# Patient Record
Sex: Male | Born: 1950
Health system: Southern US, Community
[De-identification: ages and names within clinical notes are randomized; demographics above are authoritative.]

## PROBLEM LIST (undated history)

## (undated) DIAGNOSIS — Z860100 Personal history of colon polyps, unspecified: Secondary | ICD-10-CM

## (undated) DIAGNOSIS — M1990 Unspecified osteoarthritis, unspecified site: Secondary | ICD-10-CM

## (undated) DIAGNOSIS — Z8719 Personal history of other diseases of the digestive system: Secondary | ICD-10-CM

## (undated) DIAGNOSIS — G5603 Carpal tunnel syndrome, bilateral upper limbs: Secondary | ICD-10-CM

## (undated) DIAGNOSIS — K602 Anal fissure, unspecified: Secondary | ICD-10-CM

## (undated) DIAGNOSIS — Z860101 Personal history of adenomatous and serrated colon polyps: Secondary | ICD-10-CM

## (undated) DIAGNOSIS — Z8711 Personal history of peptic ulcer disease: Secondary | ICD-10-CM

## (undated) DIAGNOSIS — I2510 Atherosclerotic heart disease of native coronary artery without angina pectoris: Secondary | ICD-10-CM

## (undated) DIAGNOSIS — Z9189 Other specified personal risk factors, not elsewhere classified: Secondary | ICD-10-CM

## (undated) DIAGNOSIS — I10 Essential (primary) hypertension: Secondary | ICD-10-CM

## (undated) DIAGNOSIS — K5730 Diverticulosis of large intestine without perforation or abscess without bleeding: Secondary | ICD-10-CM

## (undated) DIAGNOSIS — C61 Malignant neoplasm of prostate: Secondary | ICD-10-CM

## (undated) DIAGNOSIS — E785 Hyperlipidemia, unspecified: Secondary | ICD-10-CM

## (undated) DIAGNOSIS — Z8709 Personal history of other diseases of the respiratory system: Secondary | ICD-10-CM

## (undated) DIAGNOSIS — Z923 Personal history of irradiation: Secondary | ICD-10-CM

## (undated) DIAGNOSIS — Z973 Presence of spectacles and contact lenses: Secondary | ICD-10-CM

## (undated) HISTORY — DX: Personal history of colon polyps, unspecified: Z86.0100

## (undated) HISTORY — DX: Personal history of colonic polyps: Z86.010

## (undated) HISTORY — DX: Atherosclerotic heart disease of native coronary artery without angina pectoris: I25.10

## (undated) HISTORY — PX: TONSILLECTOMY: SHX5217

## (undated) HISTORY — DX: Malignant neoplasm of prostate: C61

## (undated) HISTORY — DX: Personal history of irradiation: Z92.3

## (undated) HISTORY — DX: Anal fissure, unspecified: K60.2

## (undated) HISTORY — DX: Unspecified osteoarthritis, unspecified site: M19.90

## (undated) HISTORY — PX: COLONOSCOPY: SHX174

---

## 1982-04-29 HISTORY — PX: LUMBAR LAMINECTOMY: SHX95

## 1982-04-29 HISTORY — PX: CERVICAL LAMINECTOMY: SHX94

## 2004-02-27 ENCOUNTER — Ambulatory Visit (HOSPITAL_COMMUNITY): Admission: RE | Admit: 2004-02-27 | Discharge: 2004-02-27 | Payer: Self-pay | Admitting: Gastroenterology

## 2004-02-27 ENCOUNTER — Encounter (INDEPENDENT_AMBULATORY_CARE_PROVIDER_SITE_OTHER): Payer: Self-pay | Admitting: Specialist

## 2004-09-15 ENCOUNTER — Emergency Department (HOSPITAL_COMMUNITY): Admission: EM | Admit: 2004-09-15 | Discharge: 2004-09-15 | Payer: Self-pay | Admitting: Emergency Medicine

## 2007-07-27 ENCOUNTER — Ambulatory Visit (HOSPITAL_COMMUNITY): Admission: RE | Admit: 2007-07-27 | Discharge: 2007-07-27 | Payer: Self-pay | Admitting: Otolaryngology

## 2007-10-13 ENCOUNTER — Encounter: Admission: RE | Admit: 2007-10-13 | Discharge: 2007-10-13 | Payer: Self-pay | Admitting: Otolaryngology

## 2010-09-14 NOTE — Op Note (Signed)
NAME:  Gary Blair, Gary Blair NO.:  [id number]   MEDICAL RECORD NO.:  [id number]          PATIENT TYPE:  AMB   LOCATION:  ENDO                         FACILITY:  [facility]   PHYSICIAN:  Graylin Shiver, M.D.   DATE OF BIRTH:  1950-10-20   DATE OF PROCEDURE:  02/27/2004  DATE OF DISCHARGE:                                 OPERATIVE REPORT   PROCEDURES:  Colonoscopy with polypectomy.   INDICATIONS:  Screening.   Informed consent was obtained after explanation of the risks of bleeding,  infection and perforation   PREMEDICATIONS:  Fentanyl 50 mcg IV and Versed 6 mg IV.   DESCRIPTION OF PROCEDURE:  With the patient in the left lateral decubitus  position, a rectal exam was performed.  No masses were felt.  The Olympus  colonoscope was inserted into the rectum and advanced around the colon to  the cecum.  The landmarks were identified.  The cecum and ascending colon  were normal.  The transverse colon was normal.  The descending colon and  sigmoid revealed diverticulosis.  In the proximal rectum, there was an 8 mm  sessile polyp which was snared with a mini snare and removed by snare  cautery technique.  The polyp was retrieved.  The cautery site looked good.  He tolerated the procedure well without complications.   IMPRESSION:  1.  Rectal polyp.  211.4  2.  Diverticulosis.   PLAN:  The pathology will be checked.      SFG/MEDQ  D:  02/27/2004  T:  02/27/2004  Job:  [id number]   cc:   Duncan Dull, M.D.  [address]  [place]  [place] [address]  Fax: [phone]

## 2011-05-24 ENCOUNTER — Encounter (HOSPITAL_COMMUNITY): Payer: Self-pay | Admitting: *Deleted

## 2011-05-24 ENCOUNTER — Emergency Department (HOSPITAL_COMMUNITY): Payer: BC Managed Care – PPO

## 2011-05-24 ENCOUNTER — Emergency Department (HOSPITAL_COMMUNITY)
Admission: EM | Admit: 2011-05-24 | Discharge: 2011-05-24 | Disposition: A | Discharge status: Home / Self Care | Payer: BC Managed Care – PPO | Attending: Emergency Medicine | Admitting: Emergency Medicine

## 2011-05-24 DIAGNOSIS — S61219A Laceration without foreign body of unspecified finger without damage to nail, initial encounter: Secondary | ICD-10-CM

## 2011-05-24 DIAGNOSIS — S6990XA Unspecified injury of unspecified wrist, hand and finger(s), initial encounter: Secondary | ICD-10-CM

## 2011-05-24 DIAGNOSIS — W230XXA Caught, crushed, jammed, or pinched between moving objects, initial encounter: Secondary | ICD-10-CM | POA: Insufficient documentation

## 2011-05-24 DIAGNOSIS — S61209A Unspecified open wound of unspecified finger without damage to nail, initial encounter: Secondary | ICD-10-CM | POA: Insufficient documentation

## 2011-05-24 DIAGNOSIS — M79609 Pain in unspecified limb: Secondary | ICD-10-CM | POA: Insufficient documentation

## 2011-05-24 NOTE — ED Provider Notes (Signed)
History     CSN: [id number]  Arrival date & time 05/24/11  1459   First MD Initiated Contact with Patient 05/24/11 1512      Chief Complaint  Patient presents with  . 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 pt reports was crush injury.    (Consider location/radiation/quality/duration/timing/severity/associated sxs/prior treatment) HPI Comments: Patient reports that just prior to arrival while at work his left 3rd and 4th digit was crushed in between a freight and a metal rack.  He denies any numbness or tingling.  He can move both of his fingers.  He reports that his tetanus is UTD.  Patient is a 61 y.o. male presenting with skin laceration. The history is provided by the patient.  Laceration  The laceration is 2 cm in size. The pain is mild. The pain has been constant since onset. He reports no foreign bodies present. His tetanus status is UTD.    History reviewed. No pertinent past medical history.  History reviewed. No pertinent past surgical history.  History reviewed. No pertinent family history.  History  Substance Use Topics  . Smoking status: Not on file  . Smokeless tobacco: Not on file  . Alcohol Use: Not on file      Review of Systems  Constitutional: Negative for fever and chills.  Respiratory: Negative for shortness of breath.   Gastrointestinal: Negative for nausea and vomiting.  Skin: Positive for wound. Negative for color change.  Neurological: Negative for dizziness, syncope and light-headedness.    Allergies  Ibuprofen; Penicillins; and Sulfa antibiotics  Home Medications  No current outpatient prescriptions on file.  BP 148/83  Pulse 97  Temp(Src) 97.9 F (36.6 C) (Oral)  Resp 20  SpO2 98%  Physical Exam  Nursing note and vitals reviewed. Constitutional: He is oriented to person, place, and time. He appears well-developed and well-nourished. No distress.    HENT:  Head: Normocephalic and atraumatic.  Cardiovascular: Normal rate, regular rhythm and normal heart sounds.   Pulmonary/Chest: Effort normal and breath sounds normal.  Musculoskeletal:       Full ROM of DIP and PIP of the left 3rd and 4th digits.  No apparent tendon damage.  1.5 cm skin tear on the dorsal surface of the 3rd digit just proximal and lateral to the base of the finger nail with associated skin flap. No nail bed involvement. Small subungal hematoma of the left 3rd digit.  No pain.  Small 0.5 cm abrasion on the dorsal distal surface of the left 4th digit proximal to the nail bed.   Neurological: He is alert and oriented to person, place, and time.  Skin: He is not diaphoretic.  Psychiatric: He has a normal mood and affect.    ED Course  Procedures (including critical care time)  Labs Reviewed - No data to display No results found.   No diagnosis found.  LACERATION REPAIR Performed by: Anne Shutter, Jenilyn Magana Authorized by: Anne Shutter, Herbert Seta Consent: Verbal consent obtained. Risks and benefits: risks, benefits and alternatives were discussed Consent given by: patient Patient identity confirmed: provided demographic data Prepped and Draped in normal sterile fashion Wound explored  Laceration Location: left 3rd digit lateral to the nail bed.  Laceration Length: 1.5 cm  No Foreign Bodies seen or palpated  Irrigation method: syringe Amount of cleaning: standard  Skin closure: Dermabond  Patient tolerance: Patient tolerated the procedure well with no immediate complications.  MDM  Patient with negative xrays.  No gaping laceration visualized.  Patient with skin tear with a flap.  Flap glued down with Dermabond to protect underlying skin.  Patient given follow up with Hand if needed.  Patient declined pain medication.          Pascal Lux [place], PA-C 05/25/11 2014

## 2011-05-26 NOTE — ED Provider Notes (Signed)
Medical screening examination/treatment/procedure(s) were performed by non-physician practitioner and as supervising physician I was immediately available for consultation/collaboration.    Abdalrahman Clementson L Alik Mawson, MD 05/26/11 2248 

## 2012-02-05 ENCOUNTER — Encounter: Payer: Self-pay | Admitting: *Deleted

## 2012-04-07 ENCOUNTER — Ambulatory Visit (INDEPENDENT_AMBULATORY_CARE_PROVIDER_SITE_OTHER): Payer: BC Managed Care – PPO | Admitting: Family Medicine

## 2012-04-07 ENCOUNTER — Encounter: Payer: Self-pay | Admitting: Family Medicine

## 2012-04-07 VITALS — BP 138/84 | HR 80 | Ht 64.0 in | Wt 120.0 lb

## 2012-04-07 DIAGNOSIS — Z125 Encounter for screening for malignant neoplasm of prostate: Secondary | ICD-10-CM

## 2012-04-07 DIAGNOSIS — Z0000 Encounter for general adult medical examination without abnormal findings: Secondary | ICD-10-CM

## 2012-04-07 DIAGNOSIS — E7800 Pure hypercholesterolemia, unspecified: Secondary | ICD-10-CM

## 2012-04-07 DIAGNOSIS — F17200 Nicotine dependence, unspecified, uncomplicated: Secondary | ICD-10-CM

## 2012-04-07 DIAGNOSIS — R7301 Impaired fasting glucose: Secondary | ICD-10-CM | POA: Insufficient documentation

## 2012-04-07 LAB — POCT URINALYSIS DIPSTICK
Bilirubin, UA: NEGATIVE
Glucose, UA: NEGATIVE
Ketones, UA: NEGATIVE
Protein, UA: NEGATIVE
Urobilinogen, UA: NEGATIVE

## 2012-04-07 LAB — LIPID PANEL
HDL: 61 mg/dL (ref >39)
LDL Cholesterol: 109 mg/dL — ABNORMAL HIGH (ref 0–99)
Total CHOL/HDL Ratio: 3 Ratio
Triglycerides: 64 mg/dL (ref <150)
VLDL: 13 mg/dL (ref 0–40)

## 2012-04-07 LAB — GLUCOSE, RANDOM: Glucose, Bld: 111 mg/dL — ABNORMAL HIGH (ref 70–99)

## 2012-04-07 NOTE — Patient Instructions (Signed)
HEALTH MAINTENANCE RECOMMENDATIONS:  It is recommended that you get at least 30 minutes of aerobic exercise at least 5 days/week (for weight loss, you may need as much as 60-90 minutes). This can be any activity that gets your heart rate up. This can be divided in 10-15 minute intervals if needed, but try and build up your endurance at least once a week.  Weight bearing exercise is also recommended twice weekly.  Eat a healthy diet with lots of vegetables, fruits and fiber.  "Colorful" foods have a lot of vitamins (ie green vegetables, tomatoes, red peppers, etc).  Limit sweet tea, regular sodas and alcoholic beverages, all of which has a lot of calories and sugar.  Up to 2 alcoholic drinks daily may be beneficial for men (unless trying to lose weight, watch sugars).  Drink a lot of water.  Sunscreen of at least SPF 30 should be used on all sun-exposed parts of the skin when outside between the hours of 10 am and 4 pm (not just when at beach or pool, but even with exercise, golf, tennis, and yard work!)  Use a sunscreen that says "broad spectrum" so it covers both UVA and UVB rays, and make sure to reapply every 1-2 hours.  Remember to change the batteries in your smoke detectors when changing your clock times in the spring and fall.  Use your seat belt every time you are in a car, and please drive safely and not be distracted with cell phones and texting while driving.  Congratulations on making the decision to quit smoking in January.  Let me know if I can be of any assistance.  Use the 1800QUITNOW or [place]quit[place] for free counseling.

## 2012-04-07 NOTE — Progress Notes (Signed)
Chief Complaint  Patient presents with  . Annual Exam    new patient fasting CPE and DOT forms. Has had a sore throat at night x 7 years. (has been to ENT)   Gary Blair is a 61 y.o. male who presents for a complete physical. He is a new patient.  He also needs DOT forms filled out.  He has no other specific concerns.  A few records from [facility] were available and reviewed.   Immunization History  Administered Date(s) Administered  . Influenza Split 03/16/2005, 02/15/2006, 02/01/2007, 02/25/2008, 04/20/2009, 01/28/2012  . Pneumococcal Polysaccharide 12/14/2004  . Td 06/27/1989, 09/30/2001  . Tdap 01/02/2006  . Zoster 04/26/2011   Last colonoscopy: 03/2009, due again 2015 Last PSA:  03/2011, 2.91 Dentist: twice yearly Ophtho: every 2 years, 02/2012 Exercise: unloading trucks, active on job.  Sees Dr. Nicholas Lose (derm) yearly  Past Medical History  Diagnosis Date  . Arthritis   . Gastric ulcer     history of  . Allergy   . Chronic sinusitis   . Colon polyps 03/29/2009    adenomatous-last colonoscopy done 03/29/2009  . Diverticulosis     moderate  . CTS (carpal tunnel syndrome)   . Elevated PSA     per urology as long as it stays under 3-3.5 and on flat/stable curve no need for further workup.  Marland Kitchen Hyperlipidemia     borderline.  . Torn meniscus 2013    R knee    Past Surgical History  Procedure Date  . Lumbar laminectomy 1984  . Tonsillectomy child  . Colonoscopy 03/2009    in [place]; adenomatous polyp; due again 03/2014    History   Social History  . Marital Status: Married    Spouse Name: N/A    Number of Children: 1  . Years of Education: N/A   Occupational History  . Truck driver    Social History Main Topics  . Smoking status: Current Every Day Smoker -- 1.0 packs/day for 40 years    Types: Cigarettes  . Smokeless tobacco: Never Used     Comment: has quit date set for 04/29/12  . Alcohol Use: Yes     Comment: 2 drinks per day  . Drug Use: No  .  Sexually Active: Not on file   Other Topics Concern  . Not on file   Social History Narrative   Married. No pets.  Daughter lives in [place] [place]    Family History  Problem Relation Age of Onset  . Heart attack Mother   . Stroke Father   . Hypertension Father   . Cancer Father     prostate in his late 64's  . Heart attack Brother   . Diabetes Neg Hx     Current outpatient prescriptions:aspirin 81 MG tablet, Take 81 mg by mouth daily., Disp: , Rfl: ;  Multiple Vitamins-Minerals (MULTIVITAMIN WITH MINERALS) tablet, Take 1 tablet by mouth daily., Disp: , Rfl: ;  vitamin C (ASCORBIC ACID) 500 MG tablet, Take 500 mg by mouth daily., Disp: , Rfl:   Allergies  Allergen Reactions  . Ibuprofen Itching  . Penicillins     Pruritis, joint pains,throat swelling when he took Augmentin  . Sulfa Antibiotics Rash   ROS:  The patient denies anorexia, fever, weight changes, headaches,  vision loss, decreased hearing, ear pain, chest pain, palpitations, dizziness, syncope, dyspnea on exertion, cough, swelling, nausea, vomiting, diarrhea, constipation, abdominal pain, melena, hematochezia, indigestion/heartburn, hematuria, incontinence, erectile dysfunction, nocturia, weakened urine stream, dysuria, genital  lesions, weakness, tremor, suspicious skin lesions, depression, anxiety, abnormal bleeding/bruising, or enlarged lymph nodes.  Wears braces at night bilaterally for CTS--has tingling/numbness occasionally even when wearing braces, symptomatic only at night.  Occasional right knee pain  PHYSICAL EXAM: BP 138/84  Pulse 80  Ht 5\' 4"  (1.626 m)  Wt 120 lb (54.432 kg)  BMI 20.60 kg/m2  General Appearance:    Alert, cooperative, no distress, appears stated age  Head:    Normocephalic, without obvious abnormality, atraumatic  Eyes:    PERRL, conjunctiva/corneas clear, EOM's intact, fundi    benign  Ears:    Normal TM's and external ear canals; R TM partly obscured by cerumen.  L normal  Nose:   Nares  normal, mucosa normal, no drainage or sinus   tenderness  Throat:   Lips, mucosa, and tongue normal; teeth and gums normal  Neck:   Supple, no lymphadenopathy;  thyroid:  no   enlargement/tenderness/nodules; no carotid   bruit or JVD  Back:    Spine nontender, no curvature, ROM normal, no CVA     tenderness  Lungs:     Clear to auscultation bilaterally without wheezes, rales or     ronchi; respirations unlabored  Chest Wall:    No tenderness or deformity   Heart:    Regular rate and rhythm, S1 and S2 normal, no murmur, rub   or gallop  Breast Exam:    No chest wall tenderness, masses or gynecomastia  Abdomen:     Soft, non-tender, nondistended, normoactive bowel sounds,    no masses, no hepatosplenomegaly  Genitalia:    Normal male external genitalia without lesions.  Testicles without masses.  No inguinal hernias.  Rectal:    Normal sphincter tone, no masses or tenderness; guaiac negative stool.  Prostate smooth, no nodules, not enlarged.  Extremities:   No clubbing, cyanosis or edema  Pulses:   2+ and symmetric all extremities  Skin:   Skin color, texture, turgor normal, no rashes or lesions  Lymph nodes:   Cervical, supraclavicular, and axillary nodes normal  Neurologic:   CNII-XII intact, normal strength, sensation and gait; reflexes 2+ and symmetric throughout          Psych:   Normal mood, affect, hygiene and grooming.     ASSESSMENT/PLAN:  1. Routine general medical examination at a health care facility  POCT Urinalysis Dipstick, Visual acuity screening, Tympanometry  2. Tobacco use disorder    3. Impaired fasting glucose  Hemoglobin A1c, Glucose, random  4. Pure hypercholesterolemia  Lipid panel  5. Special screening for malignant neoplasm of prostate  PSA    Risks of smoking and benefits of smoking reviewed.  Counseled regarding some techniques that might help him be successful with his planned quitting.  Discussed PSA screening (risks/benefits), recommended at least 30  minutes of aerobic activity at least 5 days/week; proper sunscreen use reviewed; healthy diet and alcohol recommendations (less than or equal to 2 drinks/day) reviewed; regular seatbelt use; changing batteries in smoke detectors. Self-testicular exams. Immunization recommendations discussed, UTD.  Colonoscopy recommendations reviewed, UTD  If he doesn't quit smoking, consider pneumovax booster next year, vs just giving booster at age 62.  DOT forms filled out

## 2012-04-08 ENCOUNTER — Encounter: Payer: Self-pay | Admitting: Family Medicine

## 2012-09-23 ENCOUNTER — Ambulatory Visit (INDEPENDENT_AMBULATORY_CARE_PROVIDER_SITE_OTHER): Payer: BC Managed Care – PPO | Admitting: Family Medicine

## 2012-09-23 ENCOUNTER — Encounter: Payer: Self-pay | Admitting: Family Medicine

## 2012-09-23 VITALS — BP 136/80 | HR 88 | Temp 97.7°F | Ht 63.5 in | Wt 117.0 lb

## 2012-09-23 DIAGNOSIS — F17200 Nicotine dependence, unspecified, uncomplicated: Secondary | ICD-10-CM

## 2012-09-23 DIAGNOSIS — K1120 Sialoadenitis, unspecified: Secondary | ICD-10-CM

## 2012-09-23 NOTE — Progress Notes (Signed)
Chief Complaint  Patient presents with  . lumps in throat    in the past 2 weeks lumps have gotten worse and swollen and hurting   He has noticed swollen glands/"knots" under his chin, and they are more pronounced and sore L>R in the last 10-14 days.  Throat has also been more sore in the last 2 weeks.  He states that the swelling was almost the size of a golfball over the weekend, and has decreased in size in the last few days.  Denies any cheek swelling.  He has had some chronic/intermittent sore throat x 7 years, usually in the mornings and resolves later in the day. Complains of dry mouth right now, and ongoing x years.  Ophtho has mentioned dry eyes, but not requiring any treatment.  Quit smoking for 6 weeks in January; he is back to smoking just 1/2 PPD.  Hasn't smoked since Sunday, due to his throat bothering him, and knowing he needs to quit.  Past Medical History  Diagnosis Date  . Arthritis   . Gastric ulcer     history of  . Allergy   . Chronic sinusitis   . Colon polyps 03/29/2009    adenomatous-last colonoscopy done 03/29/2009  . Diverticulosis     moderate  . CTS (carpal tunnel syndrome)   . Elevated PSA     per urology as long as it stays under 3-3.5 and on flat/stable curve no need for further workup.  Marland Kitchen Hyperlipidemia     borderline.  . Torn meniscus 2013    R knee   Past Surgical History  Procedure Laterality Date  . Lumbar laminectomy  1984  . Tonsillectomy  child  . Colonoscopy  03/2009    in [place]; adenomatous polyp; due again 03/2014   History   Social History  . Marital Status: Married    Spouse Name: N/A    Number of Children: 1  . Years of Education: N/A   Occupational History  . Truck driver    Social History Main Topics  . Smoking status: Current Every Day Smoker -- 1.00 packs/day for 40 years    Types: Cigarettes  . Smokeless tobacco: Never Used     Comment: has quit date set for 04/29/12  . Alcohol Use: Yes     Comment: 2 drinks per  day  . Drug Use: No  . Sexually Active: Not on file   Other Topics Concern  . Not on file   Social History Narrative   Married. No pets.  Daughter lives in [place]   Current outpatient prescriptions:aspirin 81 MG tablet, Take 81 mg by mouth daily., Disp: , Rfl: ;  Multiple Vitamins-Minerals (MULTIVITAMIN WITH MINERALS) tablet, Take 1 tablet by mouth daily., Disp: , Rfl: ;  vitamin C (ASCORBIC ACID) 500 MG tablet, Take 500 mg by mouth daily., Disp: , Rfl:   Allergies  Allergen Reactions  . Ibuprofen Itching  . Penicillins     Pruritis, joint pains,throat swelling when he took Augmentin  . Sulfa Antibiotics Rash   ROS:  Denies fevers, URI symptoms, cough, shortness of breath, chest pain, rashes, edema, bleeding/bruising.  Denies nausea, vomiting, diarrhea, weight changes.  PHYSICAL EXAM: BP 136/80  Pulse 88  Ht 5' 3.5" (1.613 m)  Wt 117 lb (53.071 kg)  BMI 20.4 kg/m2  Well developed, pleasant male in no distress HEENT:  Conjunctiva clear.  TM's and EAC's normal.  Nasal mucosa normal, no edema or purulence.  Sinuses nontender.  OP clear--no erythema  or lesions, some thick secretions. Neck: no lymphadenopathy or thyromegaly.  Swollen submandibular glands L>R, minimally tender. No overlying warmth, erythema. No fluctuance. Heart: regular rate and rhythm without murmur Lungs: clear bilaterally Skin: no rashes  ASSESSMENT/PLAN: Sialoadenitis - Plan: CBC with Differential, ANA  Tobacco use disorder  Mild sialoadenitis--likely not infected, just obstructed given improvement in size of swelling on its own.  Given symptoms of dry mouth and eyes, look for underlying etiology (ie sjogren's).  Lemon drops recommended  Encouraged to quit smoking

## 2012-09-23 NOTE — Patient Instructions (Addendum)
Sialadenitis  Sialadenitis is an inflammation (soreness) of the salivary glands. The parotid is the main salivary gland. It lies behind the angle of the jaw below the ear. The saliva produced comes out of a tiny opening (duct) inside the cheek on either side. This is usually at the level of the upper back teeth. If it is swollen, the ear is pushed up and out. This helps tell this condition apart from a simple lymph gland infection (swollen glands) in the same area. Mumps has mostly disappeared since the start of immunization against mumps. Now the most common cause of parotitis is germ (bacterial) infection or inflammation of the lymphatics (the lymph channels). The other major salivary gland is located in the floor of the mouth. Smaller salivary glands are located in the mouth. This includes the:   Lips.   Lining of the mouth.   Pharynx.   Hard palate (front part of the roof of the mouth).  The salivary glands do many things, including:   Lubrication.   Breaking down food.   Production of hormones and antibodies (to protect against germs which may cause illness).   Help with the sense of taste.  ACUTE BACTERIAL SIALADENITIS  This is a sudden inflammatory response to bacterial infection. This causes redness, pain, swelling and tenderness over the infected gland. In the past, it was common in dehydrated and debilitated patients often following an operation. It is now more commonly seen:   After radiotherapy.   In patients with poor immune systems.  Treatment is:   The correction of fluid balance (rehydration).   Medicine that kill germs (antibiotics).   Pain relief.  CHRONIC RECURRENT SIALADENITIS  This refers to repeated episodes of discomfort and swelling of one of the salivary glands. It often occurs after eating. Chronic sialadenitis is usually less painful. It is associated with recurrent enlargement of a salivary gland, often following meals, and typically with an absence of redness. The chronic  form of the disease often is associated with conditions linked to decreased salivary flow, rather than dehydration (loss of body fluids). These conditions include:   A stone, or concretion, formed in the gallbladder, kidneys, or other parts of the body (calculi).   Salivary stasis.   A change in the fluid and electrolyte (the salts in your body fluids) makeup of the gland.  It is treated with:   Gland massage.   Methods to stimulate the flow of saliva, (for example, lemon juice).   Antibiotics if required.  Surgery to remove the gland is possible, but its benefits need to be balanced against risks.   VIRAL SIALADENITIS  Several viruses infect the salivary glands. Some of these include the mumps virus that commonly infects the parotid gland. Other viruses causing problems are:   The HIV virus.   Herpes.   Some of the influenza ("flu") viruses.  RECURRENT SIALADENITIS IN CHILDREN  This condition is thought to be due to swelling or ballooning of the ducts. It results in the same symptoms as acute bacterial parotitis. It is usually caused by germs (bacteria). It is often treated using penicillin. It may get well without treatment. Surgery is usually not required.  TUBERCULOUS SIALADENITIS  The salivary glands may become infected with the same bacteria causing tuberculosis ("TB"). Treatment is with anti-tuberculous antibiotic therapy.  OTHER UNCOMMON CAUSES OF SIALADENITIS    Sjogren's syndrome is a condition in which arthritis is associated with a decrease in activity of the glands of the body that produce saliva   and tears. The diagnosis is made with blood tests or by examination of a piece of tissue from the inside of the lip. Some people with this condition are bothered by:   A dry mouth.   Intermittent salivary gland enlargement.   Atypical mycobacteria is a germ similar to tuberculosis. It often infects children. It is often resistant to antibiotic treatment. It may require surgical treatment to remove  the infected salivary gland.   Actinomycosis is an infection of the parotid gland that may also involve the overlying skin. The diagnosis is made by detecting granules of sulphur produced by the bacteria on microscopic examination. Treatment is a prolonged course of penicillin for up to one year.   Nutritional causes include vitamin deficiencies and bulimia.   Diabetes and problems with your thyroid.   Obesity, cirrhosis, and malabsorption are some metabolic causes.  HOME CARE INSTRUCTIONS    Apply ice bags every 2 hours for 15-20 minutes, while awake, to the sore gland for 24 hours, then as directed by your caregiver. Place the ice in a plastic bag with a towel around it to prevent frostbite to the skin.   Only take over-the-counter or prescription medicines for pain, discomfort, or fever as directed by your caregiver.  SEEK IMMEDIATE MEDICAL CARE IF:    There is increased pain or swelling in your gland that is not controlled with medicine.   An oral temperature above 102 F (38.9 C) develops, not controlled by medicine.   You develop difficulty opening your mouth, swallowing, or speaking.  Document Released: 10/05/2001 Document Revised: 07/08/2011 Document Reviewed: 11/30/2007  [employer] Patient Information 2014 [employer].

## 2012-09-24 LAB — CBC WITH DIFFERENTIAL/PLATELET
Basophils Relative: 1 % (ref 0–1)
Eosinophils Absolute: 0.2 10*3/uL (ref 0.0–0.7)
Eosinophils Relative: 3 % (ref 0–5)
HCT: 44.4 % (ref 39.0–52.0)
Hemoglobin: 15 g/dL (ref 13.0–17.0)
Lymphs Abs: 1.7 10*3/uL (ref 0.7–4.0)
MCV: 91.2 fL (ref 78.0–100.0)
Monocytes Relative: 11 % (ref 3–12)
Neutro Abs: 3.7 10*3/uL (ref 1.7–7.7)

## 2013-03-04 ENCOUNTER — Other Ambulatory Visit: Payer: Self-pay

## 2013-04-08 ENCOUNTER — Ambulatory Visit (INDEPENDENT_AMBULATORY_CARE_PROVIDER_SITE_OTHER): Payer: BC Managed Care – PPO | Admitting: Family Medicine

## 2013-04-08 ENCOUNTER — Ambulatory Visit
Admission: RE | Admit: 2013-04-08 | Discharge: 2013-04-08 | Disposition: A | Discharge status: Home / Self Care | Payer: BC Managed Care – PPO | Source: Ambulatory Visit | Attending: Family Medicine | Admitting: Family Medicine

## 2013-04-08 ENCOUNTER — Encounter: Payer: Self-pay | Admitting: Family Medicine

## 2013-04-08 VITALS — BP 150/80 | HR 120 | Ht 62.75 in | Wt 114.0 lb

## 2013-04-08 DIAGNOSIS — Z0000 Encounter for general adult medical examination without abnormal findings: Secondary | ICD-10-CM

## 2013-04-08 DIAGNOSIS — R030 Elevated blood-pressure reading, without diagnosis of hypertension: Secondary | ICD-10-CM

## 2013-04-08 DIAGNOSIS — R000 Tachycardia, unspecified: Secondary | ICD-10-CM

## 2013-04-08 DIAGNOSIS — R634 Abnormal weight loss: Secondary | ICD-10-CM

## 2013-04-08 DIAGNOSIS — R7301 Impaired fasting glucose: Secondary | ICD-10-CM

## 2013-04-08 DIAGNOSIS — E7800 Pure hypercholesterolemia, unspecified: Secondary | ICD-10-CM

## 2013-04-08 DIAGNOSIS — IMO0001 Reserved for inherently not codable concepts without codable children: Secondary | ICD-10-CM

## 2013-04-08 DIAGNOSIS — Z125 Encounter for screening for malignant neoplasm of prostate: Secondary | ICD-10-CM

## 2013-04-08 DIAGNOSIS — Z23 Encounter for immunization: Secondary | ICD-10-CM

## 2013-04-08 LAB — POCT URINALYSIS DIPSTICK
Glucose, UA: NEGATIVE
Ketones, UA: NEGATIVE
Nitrite, UA: NEGATIVE
Protein, UA: NEGATIVE
Urobilinogen, UA: NEGATIVE
pH, UA: 8

## 2013-04-08 LAB — COMPREHENSIVE METABOLIC PANEL
ALT: 19 U/L (ref 0–53)
AST: 19 U/L (ref 0–37)
Albumin: 4.4 g/dL (ref 3.5–5.2)
BUN: 11 mg/dL (ref 6–23)
CO2: 26 mEq/L (ref 19–32)
Creat: 0.9 mg/dL (ref 0.50–1.35)
Total Bilirubin: 0.7 mg/dL (ref 0.3–1.2)

## 2013-04-08 LAB — HEMOGLOBIN A1C
Hgb A1c MFr Bld: 5.9 % — ABNORMAL HIGH (ref <5.7)
Mean Plasma Glucose: 123 mg/dL — ABNORMAL HIGH (ref <117)

## 2013-04-08 NOTE — Patient Instructions (Addendum)
  HEALTH MAINTENANCE RECOMMENDATIONS:  It is recommended that you get at least 30 minutes of aerobic exercise at least 5 days/week (for weight loss, you may need as much as 60-90 minutes). This can be any activity that gets your heart rate up. This can be divided in 10-15 minute intervals if needed, but try and build up your endurance at least once a week.  Weight bearing exercise is also recommended twice weekly.  Eat a healthy diet with lots of vegetables, fruits and fiber.  "Colorful" foods have a lot of vitamins (ie green vegetables, tomatoes, red peppers, etc).  Limit sweet tea, regular sodas and alcoholic beverages, all of which has a lot of calories and sugar.  Up to 2 alcoholic drinks daily may be beneficial for men (unless trying to lose weight, watch sugars).  Drink a lot of water.  Sunscreen of at least SPF 30 should be used on all sun-exposed parts of the skin when outside between the hours of 10 am and 4 pm (not just when at beach or pool, but even with exercise, golf, tennis, and yard work!)  Use a sunscreen that says "broad spectrum" so it covers both UVA and UVB rays, and make sure to reapply every 1-2 hours.  Remember to change the batteries in your smoke detectors when changing your clock times in the spring and fall.  Use your seat belt every time you are in a car, and please drive safely and not be distracted with cell phones and texting while driving.  Go to [facility] for chest x-ray.  Check your blood pressure elsewhere (ie at pharmacy) and write down.  Return for re-evaluation if BP is running >140/90 regularly

## 2013-04-08 NOTE — Progress Notes (Signed)
Chief Complaint  Patient presents with  . Annual Exam    fasting annual exam, no concerns.    Gary Blair is a 62 y.o. male who presents for a complete physical.  He has no specific concerns.  He quit smoking 12/1, using [facility].  They have been sending him the patches and the gum, and so far so good. He was last seen for sialoadenitis. The pain resolved, but the glands never shrunk entirely back to normal.  He was not aware of any weight loss, until brought to his attention today.  He also wasn't aware of his heart beating fast.   Immunization History  Administered Date(s) Administered  . Influenza Split 03/16/2005, 02/15/2006, 02/01/2007, 02/25/2008, 04/20/2009, 01/28/2012  . Influenza Whole 02/09/2013  . Pneumococcal Polysaccharide-23 12/14/2004  . Td 06/27/1989, 09/30/2001  . Tdap 01/02/2006  . Zoster 04/26/2011   Last colonoscopy: 03/2009, due again 2015  Last PSA: 03/2012 Dentist: twice yearly  Ophtho: every 2 years, 02/2012  Exercise: unloading trucks, active on job, yardwork Sees Dr. Nicholas Lose (derm) yearly   Past Medical History  Diagnosis Date  . Arthritis     ?told when much younger, not aware of a particular joint  . Gastric ulcer     history of  . Allergy   . Chronic sinusitis   . Colon polyps 03/29/2009    adenomatous-last colonoscopy done 03/29/2009  . Diverticulosis     moderate  . CTS (carpal tunnel syndrome)   . Elevated PSA     per urology as long as it stays under 3-3.5 and on flat/stable curve no need for further workup.  Marland Kitchen Hyperlipidemia     borderline.  . Torn meniscus 2013    R knee    Past Surgical History  Procedure Laterality Date  . Lumbar laminectomy  1984  . Tonsillectomy  child  . Colonoscopy  03/2009    in [place]; adenomatous polyp; due again 03/2014    History   Social History  . Marital Status: Married    Spouse Name: N/A    Number of Children: 1  . Years of Education: N/A   Occupational History  . Truck driver     Social History Main Topics  . Smoking status: Former Smoker -- 1.00 packs/day for 40 years    Types: Cigarettes    Quit date: 03/29/2013  . Smokeless tobacco: Never Used     Comment: has quit date set for 04/29/12  . Alcohol Use: Yes     Comment: 2 drinks per day on average  . Drug Use: No  . Sexual Activity: Not Currently   Other Topics Concern  . Not on file   Social History Narrative   Married. No pets.  Daughter lives in [place]    Family History  Problem Relation Age of Onset  . Heart attack Mother   . Stroke Father   . Hypertension Father   . Cancer Father     prostate in his late 40's  . Heart attack Brother   . Diabetes Neg Hx     Current outpatient prescriptions:aspirin 81 MG tablet, Take 81 mg by mouth daily., Disp: , Rfl: ;  Multiple Vitamins-Minerals (MULTIVITAMIN WITH MINERALS) tablet, Take 1 tablet by mouth daily., Disp: , Rfl: ;  nicotine (NICODERM CQ - DOSED IN MG/24 HOURS) 21 mg/24hr patch, Place 21 mg onto the skin daily., Disp: , Rfl: ;  vitamin C (ASCORBIC ACID) 500 MG tablet, Take 500 mg by mouth daily., Disp: ,  Rfl:   Allergies  Allergen Reactions  . Ibuprofen Itching  . Penicillins     Pruritis, joint pains,throat swelling when he took Augmentin  . Sulfa Antibiotics Rash    ROS: The patient denies anorexia, fever, weight changes (he was unaware of weight loss), headaches, vision loss, decreased hearing, ear pain, chest pain, palpitations, dizziness, syncope, dyspnea on exertion, cough, swelling, nausea, vomiting, diarrhea, constipation, abdominal pain, melena, hematochezia, indigestion/heartburn, hematuria, incontinence, erectile dysfunction, nocturia, weakened urine stream (sometimes sees a double/split stream, intermittent x years), dysuria, genital lesions, weakness, tremor, suspicious skin lesions, depression, anxiety, abnormal bleeding/bruising, or enlarged lymph nodes. Wears braces at night bilaterally for CTS--has tingling/numbness  occasionally even when wearing braces.  Has occasional mild pain during the day, R>L. Some pains behind his ears and jaw, dentist told him everything was fine, might be muscular.   PHYSICAL EXAM:  BP 150/80  Pulse 120  Ht 5' 2.75" (1.594 m)  Wt 114 lb (51.71 kg)  BMI 20.35 kg/m2 Pulse 110 on repeat, BP 160/84   General Appearance:  Alert, cooperative, no distress, appears stated age   Head:  Normocephalic, without obvious abnormality, atraumatic   Eyes:  PERRL, conjunctiva/corneas clear, EOM's intact, fundi  benign   Ears:  Normal TM's and external ear canals  Nose:  Nares normal, mucosa normal, no drainage or sinus tenderness   Throat:  Lips, mucosa, and tongue normal; teeth and gums normal   Neck:  Supple, no lymphadenopathy; thyroid: no enlargement/tenderness/nodules; no carotid  bruit or JVD. Palpable submandibular glands, symmetric, soft  Back:  Spine nontender, no curvature, ROM normal, no CVA tenderness   Lungs:  Clear to auscultation bilaterally without wheezes, rales or ronchi; respirations unlabored   Chest Wall:  No tenderness or deformity   Heart:  Regular rate and rhythm,tachycardic, S1 and S2 normal, no murmur, rub or gallop   Breast Exam:  No chest wall tenderness, masses or gynecomastia   Abdomen:  Soft, non-tender, nondistended, normoactive bowel sounds,  no masses, no hepatosplenomegaly   Genitalia:  Normal male external genitalia without lesions. Testicles without masses. No inguinal hernias.   Rectal:  Normal sphincter tone, no masses or tenderness; guaiac negative stool. Prostate smooth, no nodules, not enlarged.   Extremities:  No clubbing, cyanosis or edema   Pulses:  2+ and symmetric all extremities   Skin:  Skin color, texture, turgor normal, no rashes or lesions   Lymph nodes:  Cervical, supraclavicular, and axillary nodes normal   Neurologic:  CNII-XII intact, normal strength, sensation and gait; reflexes 2+ and symmetric throughout          Psych: Normal  mood, affect, hygiene and grooming.    ASSESSMENT/PLAN:  Routine general medical examination at a health care facility - Plan: Visual acuity screening, POCT Urinalysis Dipstick, Comprehensive metabolic panel, PSA, TSH  Pure hypercholesterolemia  Impaired fasting glucose - Plan: Comprehensive metabolic panel, Hemoglobin A1c  Special screening for malignant neoplasm of prostate - Plan: PSA  Need for prophylactic vaccination against Streptococcus pneumoniae (pneumococcus) - Plan: CANCELED: Pneumococcal conjugate vaccine 13-valent  Loss of weight - differential reviewed.  check CXR given his smoking history and unexplained weight loss.  check labs - Plan: TSH, DG Chest 2 View  Tachycardia - EKG showed sinus tach at around 100, no ischemia.  check labs. possibly related to stimulant effects of nicotine, +/- nerves? - Plan: EKG 12-Lead  Elevated BP - reviewed low sodium diet, daily exercise. possibly related to stimulant effects of nicotine replacement? Monitor  Discussed PSA screening (risks/benefits)--he has had elevated/borderline PSA's in past, following closely.  Normal exam.  Recommended at least 30 minutes of aerobic activity at least 5 days/week; proper sunscreen use reviewed; healthy diet and alcohol recommendations (less than or equal to 2 drinks/day) reviewed; regular seatbelt use; changing batteries in smoke detectors. Self-testicular exams. Immunization recommendations discussed, UTD. Colonoscopy recommendations reviewed, UTD  PSA, A1c, c-met  Prevnar-13 at age 63; already had one dose of pneumovax  Check BP elsewhere, low sodium diet.

## 2013-04-09 ENCOUNTER — Encounter: Payer: Self-pay | Admitting: Family Medicine

## 2013-04-09 LAB — PSA: PSA: 3.54 ng/mL (ref <=4.00)

## 2013-04-26 ENCOUNTER — Encounter: Payer: Self-pay | Admitting: Family Medicine

## 2013-04-26 ENCOUNTER — Ambulatory Visit (INDEPENDENT_AMBULATORY_CARE_PROVIDER_SITE_OTHER): Payer: BC Managed Care – PPO | Admitting: Family Medicine

## 2013-04-26 VITALS — BP 136/78 | HR 84 | Ht 63.5 in | Wt 120.0 lb

## 2013-04-26 DIAGNOSIS — R030 Elevated blood-pressure reading, without diagnosis of hypertension: Secondary | ICD-10-CM

## 2013-04-26 DIAGNOSIS — IMO0001 Reserved for inherently not codable concepts without codable children: Secondary | ICD-10-CM

## 2013-04-26 DIAGNOSIS — J019 Acute sinusitis, unspecified: Secondary | ICD-10-CM

## 2013-04-26 DIAGNOSIS — R7301 Impaired fasting glucose: Secondary | ICD-10-CM

## 2013-04-26 DIAGNOSIS — R000 Tachycardia, unspecified: Secondary | ICD-10-CM

## 2013-04-26 DIAGNOSIS — R634 Abnormal weight loss: Secondary | ICD-10-CM

## 2013-04-26 MED ORDER — AZITHROMYCIN 250 MG PO TABS: ORAL_TABLET | ORAL | Status: DC

## 2013-04-26 NOTE — Progress Notes (Signed)
Chief Complaint  Patient presents with  . Follow-up    follow-up on bp   Patient presents for follow-up on his blood pressure and pulse.  He cut back on use of nicotine gum, continues to use the patch, although not wearing one today.  Lower dose patch is being mailed.  Hasn't used patch since yesterday.  He denies any palpitations. He follows a low sodium diet.  He has checked his blood pressure at the pharmacy vary between 119-146/73-93, with pulse 70-90.  Frequently 120-130's/75-80, with some 140's/80/s-90.    Weight is up 6 pounds, but was wearing his shoes today (took them off for weight at his physical).  Appetite is normal.  He is complaining of sore throat, drainage collecting in his throat at night wakes him up at night.  Having nasal congestion.  He has used some Vick's, but no other OTC meds.  He is blowing out yellow-green mucus during the day, sometimes mixed with blood, and coughing up yellow-green (occasionally brown) phlegm at night and in morning.  He denies any fevers.  Past Medical History  Diagnosis Date  . Arthritis     ?told when much younger, not aware of a particular joint  . Gastric ulcer     history of  . Allergy   . Chronic sinusitis   . Colon polyps 03/29/2009    adenomatous-last colonoscopy done 03/29/2009  . Diverticulosis     moderate  . CTS (carpal tunnel syndrome)   . Elevated PSA     per urology as long as it stays under 3-3.5 and on flat/stable curve no need for further workup.  Marland Kitchen Hyperlipidemia     borderline.  . Torn meniscus 2013    R knee   Past Surgical History  Procedure Laterality Date  . Lumbar laminectomy  1984  . Tonsillectomy  child  . Colonoscopy  03/2009    in [place]; adenomatous polyp; due again 03/2014   History   Social History  . Marital Status: Married    Spouse Name: N/A    Number of Children: 1  . Years of Education: N/A   Occupational History  . Truck driver    Social History Main Topics  . Smoking status:  Former Smoker -- 1.00 packs/day for 40 years    Types: Cigarettes    Quit date: 03/29/2013  . Smokeless tobacco: Never Used     Comment: has quit date set for 04/29/12  . Alcohol Use: Yes     Comment: 2 drinks per day on average  . Drug Use: No  . Sexual Activity: Not Currently   Other Topics Concern  . Not on file   Social History Narrative   Married. No pets.  Daughter lives in [place]    Current outpatient prescriptions:aspirin 81 MG tablet, Take 81 mg by mouth daily., Disp: , Rfl: ;  Multiple Vitamins-Minerals (MULTIVITAMIN WITH MINERALS) tablet, Take 1 tablet by mouth daily., Disp: , Rfl: ;  nicotine (NICODERM CQ - DOSED IN MG/24 HOURS) 21 mg/24hr patch, Place 21 mg onto the skin daily., Disp: , Rfl: ;  vitamin C (ASCORBIC ACID) 500 MG tablet, Take 500 mg by mouth daily., Disp: , Rfl:   Allergies  Allergen Reactions  . Ibuprofen Itching  . Penicillins     Pruritis, joint pains,throat swelling when he took Augmentin  . Sulfa Antibiotics Rash   ROS:  +URI symptoms as per HPI.  Denies fevers, chest pain, palpitations, shortness of breath, nausea, vomiting, bleeding, bruising, rashes,  or other complaints.  Denies headaches, dizziness.  PHYSICAL EXAM: BP 152/80  Pulse 84  Ht 5' 3.5" (1.613 m)  Wt 120 lb (54.432 kg)  BMI 20.92 kg/m2 136/78 on repeat by MD, RA Well developed, pleasant male in no distress HEENT:  Nasal mucosa is erythematous.  No purulence noted. Sinuses nontender. OP clear Neck: no lymphadenopathy, thyromegaly or mass Heart: regular rate and rhythm without murmur Lungs: clear bilaterally Skin: no rash  Lab Results  Component Value Date   WBC 6.4 09/23/2012   HGB 15.0 09/23/2012   HCT 44.4 09/23/2012   MCV 91.2 09/23/2012   PLT 253 09/23/2012     Chemistry      Component Value Date/Time   NA 138 04/08/2013 0943   K 4.0 04/08/2013 0943   CL 103 04/08/2013 0943   CO2 26 04/08/2013 0943   BUN 11 04/08/2013 0943   CREATININE 0.90 04/08/2013 0943       Component Value Date/Time   CALCIUM 9.4 04/08/2013 0943   ALKPHOS 74 04/08/2013 0943   AST 19 04/08/2013 0943   ALT 19 04/08/2013 0943   BILITOT 0.7 04/08/2013 0943     Glucose 112  Lab Results  Component Value Date   HGBA1C 5.9* 04/08/2013   Lab Results  Component Value Date   TSH 1.818 04/08/2013   Lab Results  Component Value Date   PSA 3.54 04/08/2013   PSA 3.97 04/07/2012   CXR--normal.  ASSESSMENT/PLAN:  Acute sinusitis - Plan: azithromycin (ZITHROMAX) 250 MG tablet  Elevated blood pressure - improved.  continue low sodium diet, BP monitoring, regular exercise  Impaired fasting glucose - reviewed diet  Loss of weight - stable/improved. continue to monitor  Tachycardia - resolved.  ?if related to excessive nicotine.  continue nicotine taper   Sinus infection-- Take the antibiotic as directed--remember that it stays working for 10 days, even though you only take it for 5 days. I recommend use of mucinex (guaifenesin) at bedtime (more frequently, if needed).  Sinus rinses (neti-pot or sinus rinse kit) prior to bedtime will also help. Use nasal saline spray throughout the day.  Continue low sodium diet, periodically monitoring your blood pressures. Continue to taper down the nicotine supplement.  If your blood pressures mostly remain <140/85, no need to follow up sooner than your next physical.  If they are frequently above 140/90, return sooner to further discuss blood pressure.  Getting daily exercise and low sodium diet, and avoiding excess caffeine, nicotine, decongestants, help keep the blood pressure down.

## 2013-04-26 NOTE — Patient Instructions (Signed)
Sinus infection-- Take the antibiotic as directed--remember that it stays working for 10 days, even though you only take it for 5 days. I recommend use of mucinex (guaifenesin) at bedtime (more frequently, if needed).  Sinus rinses (neti-pot or sinus rinse kit) prior to bedtime will also help. Use nasal saline spray throughout the day.  Continue low sodium diet, periodically monitoring your blood pressures. Continue to taper down the nicotine supplement.  If your blood pressures mostly remain <140/85, no need to follow up sooner than your next physical.  If they are frequently above 140/90, return sooner to further discuss blood pressure.  Getting daily exercise and low sodium diet, and avoiding excess caffeine, nicotine, decongestants, help keep the blood pressure down.

## 2014-04-05 ENCOUNTER — Ambulatory Visit (INDEPENDENT_AMBULATORY_CARE_PROVIDER_SITE_OTHER): Payer: Self-pay | Admitting: Medical

## 2014-04-05 ENCOUNTER — Encounter: Payer: Self-pay | Admitting: Medical

## 2014-04-05 VITALS — BP 140/80 | HR 98 | Temp 97.9°F | Resp 14 | Ht 63.0 in | Wt 125.0 lb

## 2014-04-05 DIAGNOSIS — R809 Proteinuria, unspecified: Secondary | ICD-10-CM

## 2014-04-05 DIAGNOSIS — Z87891 Personal history of nicotine dependence: Secondary | ICD-10-CM

## 2014-04-05 DIAGNOSIS — G5600 Carpal tunnel syndrome, unspecified upper limb: Secondary | ICD-10-CM

## 2014-04-05 DIAGNOSIS — I10 Essential (primary) hypertension: Secondary | ICD-10-CM

## 2014-04-05 DIAGNOSIS — Z008 Encounter for other general examination: Secondary | ICD-10-CM

## 2014-04-05 DIAGNOSIS — Z0289 Encounter for other administrative examinations: Secondary | ICD-10-CM

## 2014-04-05 MED ORDER — LISINOPRIL 10 MG PO TABS: 10.0000 mg | ORAL_TABLET | Freq: Every day | ORAL | Status: DC

## 2014-04-05 NOTE — Progress Notes (Signed)
Commercial Driver Medical Examination   Gary Blair is a 63 y.o. male who presents today for a commercial driver fitness determination physical exam.  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   Last DOT physical 2 year ago.  Medical care team includes:  Dr. Rita Ohara here - primary care  The patient reports recent sore throat, felt bad earlier in the week but improving.  Review of Systems A comprehensive review of systems was reviewed and noted as below:  Eye: - corrective lenses, -lasik surgery or other eye surgery, -glaucoma, -cataracts, -macular degeneration, -monocular vision, -medication for eye condition, -blurred vision,   Ears: -hearing problems, - hearing aids, -ear pain, -ear drainage, -ear fullness, -tinnitus, -recurrent ear infection, -previous ear surgery, - vertigo, -meniere's disease  Endocrine: -polydipsia, -polyuria, -weight loss, -fainting, -dizziness, - altered or loss of consciousness, -hypoglycemia  Cardiovascular: -heart disease, -CHF, -heart attack, -cardiac stents, -bypass surgery, -other heart surgery, -hypertension, -blood clots, -pacemaker, -medications for heart condition, -chest pain, -SOB, -palpitations, -fainting, -dizziness, -dyspnea  Respiratory: -asthma, -COPD, other lung disease, -smoker, -chest tightness, - wheezing, -snoring, -daytime sleepiness, -sleep apnea or uses CPAP, -narcolepsy, -sleeping disorder  Allergy: -uncontrollable sneezing or allergy symptoms  Musculoskeletal: -missing body parts, -muscle disease, -bone disease, -spine injury, -low back pain, -medication for joints, bones, muscles or pain, -physical limitations, -joint pain, -neck pain, -limitations of neck ROM, -back surgery, orthopedic surgery, -rheumatologic condition, -gout  Neurologic: +chronic carpal tunnel, uses splints QHS, only seems to have issues at night such as pain and tingling;  otherwise -dementia, -seizures, -parkinsons, -tremor, -memory problems,  -weakness, -numbness, -tingling, -medication for neurologic condition, -medications for sleep condition  Gastric: -abdominal pain, -chronic diarrhea or IBS, -uncontrollable nausea  Kidney/Renal: -hematuria, -dialysis, kidney disease, polycystic kidney disease  Psychiatric: -homicidal thoughts, -suicidal thoughts, -prior suicide attempts, -get into fights/hurting others, -memory or concentration problems, -delusions, -hallucinations, -hospitalization for mental health problem, -depression, -anxiety, -bipolar  Drug use: - none   Reviewed their medical, surgical, family, social, medication, and allergy history and updated chart as appropriate.       Objective:   Physical Exam  BP 140/80 mmHg  Pulse 98  Temp(Src) 97.9 F (36.6 C) (Oral)  Resp 14  Ht 5\' 3"  (1.6 m)  Wt 125 lb (56.7 kg)  BMI 22.15 kg/m2   General appearance: alert, no distress, WD/WN, lean white male Skin:scattered macules, no particular worrisome lesions HEENT: normocephalic, conjunctiva/corneas normal, sclerae anicteric, PERRLA, EOMi, nares patent, no discharge or erythema, pharynx normal Oral cavity: MMM, tongue normal, teeth in good repair Neck: supple, no lymphadenopathy, no thyromegaly, no masses, normal ROM, no bruits Chest: non tender, normal shape and expansion Heart: RRR, normal S1, S2, no murmurs Lungs: CTA bilaterally, no wheezes, rhonchi, or rales Abdomen: +bs, soft, non tender, non distended, no masses, no hepatomegaly, no splenomegaly, no bruits Back: lumbar surgical scar, non tender, normal ROM, no scoliosis Musculoskeletal: left base of thumb with arthritis changes, arthriitc changes of fingers, but no limitations on ROM, otherwise upper extremities non tender, no obvious deformity, normal ROM throughout, lower extremities non tender, no obvious deformity, normal ROM throughout Extremities: no edema, no cyanosis, no clubbing Pulses: 2+ symmetric, upper and lower extremities, normal cap  refill Neurological: -tinels and phalens, but bilat thenar eminences seem decreased muscle mass, otherwise alert, oriented x 3, CN2-12 intact, strength normal upper extremities and lower extremities, sensation normal throughout, DTRs 2+ throughout, no cerebellar signs, gait normal Psychiatric: normal affect, behavior normal, pleasant  GU: normal male external genitalia, circumcised, nontender, no masses, no hernia, no lymphadenopathy Rectal: deferred  Vision:  Uncorrected Corrected Horizontal Field of Vision  Right Eye n/a 20/20 90 degrees  Left Eye  n/a 20/25 90 degrees  Both Eyes  n/a 97/94    Applicant can recognize and distinguish among traffic control signals and devices showing standard red, green, and amber colors.  Applicant meets visual acuity requirement only when wearing corrective lenses.  Monocular Vision?: No  Hearing: Passed forced whisper at 10 feet bilat  Labs: See UA  Assessment:   Encounter Diagnoses  Name Primary?  . Health examination of defined subpopulation Yes  . Proteinuria   . Carpal tunnel syndrome, unspecified laterality   . Former smoker       Plan:   DOT certificate given, copies made, qualifies for 1 year certificate due to new diagnosis of hypertension, proteinuria, and wears corrective lenses.    Proteinuria - return to discuss with Dr. Tomi Bamberger as planned this month for physical   CTS - return to discuss with Dr. Tomi Bamberger as planned this month for physical  Former smoker - quit 03/2013, congratulated him on this.

## 2014-04-05 NOTE — Patient Instructions (Signed)
Thank you for giving me the opportunity to serve you today.    Your diagnosis today includes: Encounter Diagnoses  Name Primary?  . Health examination of defined subpopulation Yes  . Proteinuria   . Carpal tunnel syndrome, unspecified laterality   . Essential hypertension   . Former smoker      Specific recommendations today include:  Unfortunately your exam today shows high blood pressure on several readings. This has been the case in the past as well  Begin lisinopril once daily in the morning high blood pressure medication  Return soon for your annual physical with Dr. Tomi Bamberger and to discuss the issues above  Return return soon as planned for CPX with Dr. Tomi Bamberger.    I have included other useful information below for your review.  Lisinopril tablets What is this medicine? LISINOPRIL (lyse IN oh pril) is an ACE inhibitor. This medicine is used to treat high blood pressure and heart failure. It is also used to protect the heart immediately after a heart attack. This medicine may be used for other purposes; ask your health care provider or pharmacist if you have questions. COMMON BRAND NAME(S): Prinivil, Zestril What should I tell my health care provider before I take this medicine? They need to know if you have any of these conditions: -diabetes -heart or blood vessel disease -immune system disease like lupus or scleroderma -kidney disease -low blood pressure -previous swelling of the tongue, face, or lips with difficulty breathing, difficulty swallowing, hoarseness, or tightening of the throat -an unusual or allergic reaction to lisinopril, other ACE inhibitors, insect venom, foods, dyes, or preservatives -pregnant or trying to get pregnant -breast-feeding How should I use this medicine? Take this medicine by mouth with a glass of water. Follow the directions on your prescription label. You may take this medicine with or without food. Take your medicine at regular  intervals. Do not stop taking this medicine except on the advice of your doctor or health care professional. Talk to your pediatrician regarding the use of this medicine in children. Special care may be needed. While this drug may be prescribed for children as young as 38 years of age for selected conditions, precautions do apply. Overdosage: If you think you have taken too much of this medicine contact a poison control center or emergency room at once. NOTE: This medicine is only for you. Do not share this medicine with others. What if I miss a dose? If you miss a dose, take it as soon as you can. If it is almost time for your next dose, take only that dose. Do not take double or extra doses. What may interact with this medicine? -diuretics -lithium -NSAIDs, medicines for pain and inflammation, like ibuprofen or naproxen -over-the-counter herbal supplements like hawthorn -potassium salts or potassium supplements -salt substitutes This list may not describe all possible interactions. Give your health care provider a list of all the medicines, herbs, non-prescription drugs, or dietary supplements you use. Also tell them if you smoke, drink alcohol, or use illegal drugs. Some items may interact with your medicine. What should I watch for while using this medicine? Visit your doctor or health care professional for regular check ups. Check your blood pressure as directed. Ask your doctor what your blood pressure should be, and when you should contact him or her. Call your doctor or health care professional if you notice an irregular or fast heart beat. Women should inform their doctor if they wish to become pregnant or think  they might be pregnant. There is a potential for serious side effects to an unborn child. Talk to your health care professional or pharmacist for more information. Check with your doctor or health care professional if you get an attack of severe diarrhea, nausea and vomiting, or if  you sweat a lot. The loss of too much body fluid can make it dangerous for you to take this medicine. You may get drowsy or dizzy. Do not drive, use machinery, or do anything that needs mental alertness until you know how this drug affects you. Do not stand or sit up quickly, especially if you are an older patient. This reduces the risk of dizzy or fainting spells. Alcohol can make you more drowsy and dizzy. Avoid alcoholic drinks. Avoid salt substitutes unless you are told otherwise by your doctor or health care professional. Do not treat yourself for coughs, colds, or pain while you are taking this medicine without asking your doctor or health care professional for advice. Some ingredients may increase your blood pressure. What side effects may I notice from receiving this medicine? Side effects that you should report to your doctor or health care professional as soon as possible: -abdominal pain with or without nausea or vomiting -allergic reactions like skin rash or hives, swelling of the hands, feet, face, lips, throat, or tongue -dark urine -difficulty breathing -dizzy, lightheaded or fainting spell -fever or sore throat -irregular heart beat, chest pain -pain or difficulty passing urine -redness, blistering, peeling or loosening of the skin, including inside the mouth -unusually weak -yellowing of the eyes or skin Side effects that usually do not require medical attention (report to your doctor or health care professional if they continue or are bothersome): -change in taste -cough -decreased sexual function or desire -headache -sun sensitivity -tiredness This list may not describe all possible side effects. Call your doctor for medical advice about side effects. You may report side effects to FDA at 1-800-FDA-1088. Where should I keep my medicine? Keep out of the reach of children. Store at room temperature between 15 and 30 degrees C (59 and 86 degrees F). Protect from moisture.  Keep container tightly closed. Throw away any unused medicine after the expiration date. NOTE: This sheet is a summary. It may not cover all possible information. If you have questions about this medicine, talk to your doctor, pharmacist, or health care provider.  2015, [employer]vier/Gold Standard. (2007-10-19 17:36:32)

## 2014-04-11 ENCOUNTER — Other Ambulatory Visit: Payer: Self-pay | Admitting: Family Medicine

## 2014-04-11 ENCOUNTER — Encounter: Payer: Self-pay | Admitting: Family Medicine

## 2014-04-11 ENCOUNTER — Ambulatory Visit (INDEPENDENT_AMBULATORY_CARE_PROVIDER_SITE_OTHER): Payer: Medicare HMO | Admitting: Family Medicine

## 2014-04-11 VITALS — BP 136/80 | HR 76 | Temp 96.9°F | Ht 63.0 in | Wt 124.0 lb

## 2014-04-11 DIAGNOSIS — Z23 Encounter for immunization: Secondary | ICD-10-CM

## 2014-04-11 DIAGNOSIS — R635 Abnormal weight gain: Secondary | ICD-10-CM

## 2014-04-11 DIAGNOSIS — I10 Essential (primary) hypertension: Secondary | ICD-10-CM

## 2014-04-11 DIAGNOSIS — R7301 Impaired fasting glucose: Secondary | ICD-10-CM

## 2014-04-11 DIAGNOSIS — J029 Acute pharyngitis, unspecified: Secondary | ICD-10-CM

## 2014-04-11 DIAGNOSIS — E7800 Pure hypercholesterolemia, unspecified: Secondary | ICD-10-CM

## 2014-04-11 DIAGNOSIS — Z5181 Encounter for therapeutic drug level monitoring: Secondary | ICD-10-CM

## 2014-04-11 DIAGNOSIS — Z1211 Encounter for screening for malignant neoplasm of colon: Secondary | ICD-10-CM

## 2014-04-11 DIAGNOSIS — Z0000 Encounter for general adult medical examination without abnormal findings: Secondary | ICD-10-CM

## 2014-04-11 DIAGNOSIS — Z125 Encounter for screening for malignant neoplasm of prostate: Secondary | ICD-10-CM

## 2014-04-11 LAB — POCT URINALYSIS DIPSTICK
Bilirubin, UA: NEGATIVE
Blood, UA: NEGATIVE
Glucose, UA: NEGATIVE
KETONES UA: NEGATIVE
Leukocytes, UA: NEGATIVE
NITRITE UA: NEGATIVE
Protein, UA: NEGATIVE
Spec Grav, UA: 1.01
UROBILINOGEN UA: NEGATIVE
pH, UA: 6

## 2014-04-11 LAB — HEMOCCULT GUIAC POC 1CARD (OFFICE): Fecal Occult Blood, POC: NEGATIVE

## 2014-04-11 NOTE — Progress Notes (Signed)
Gary Blair is a 63 y.o. male who presents for Welcome to Medicare and follow-up on chronic medical conditions.  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 BP yesterday at [facility] was 130/80. He was also noted to have protein in the urine last week; no protein in the urine today. He denies headaches, dizziness, chest pain, or onset of cough since starting medication.  8 days ago he woke up in the middle of the night with a terrible sore throat, nausea.  He cleared this throat and noticed bloody saliva/sputum.  He vomited 2-3 times.  He had also had some diarrhea, at onset of illness, but also once daily for the last 3 mornings.  He continues to have sore throat (worse at night) and some nausea, but no further vomiting.  He has some ongoing sinus issues x many years, with no recent flare.  Nasal drainage is clear.  Denies cough, but has PND.  He has had some ongoing problems with sore throat through the years.  He has seen ENT and had scope and pH monitor, and was told it was all okay. He has heartburn intermittently, but not recently.  He took prilosec for a couple of weeks, and also a course of Nexium, in the last few months for some GI symptoms, but didn't notice improvement in the mild GI symptoms that he had.  Impaired fasting glucose: he doesn't check sugars.  He tries to limit sugar in his diet (candy, sweets, no regular sodas or sweet tea--very rare).    Immunization History  Administered Date(s) Administered  . Influenza Split 03/16/2005, 02/15/2006, 02/01/2007, 02/25/2008, 04/20/2009, 01/28/2012  . Influenza Whole 02/09/2013  . Influenza,trivalent, recombinat, inj, PF 01/08/2014  . Pneumococcal Polysaccharide-23 12/14/2004  . Td 06/27/1989, 09/30/2001  . Tdap 01/02/2006  . Zoster 04/26/2011   Last colonoscopy: 03/2009, due again 2015--scheduled for  12/30 with [facility] in [place] Last PSA: 03/2013 Dentist: twice yearly  Ophtho: yearly, went in 01/2014, and has scheduled already for 01/2015 Exercise: unloading trucks, active on job, Haematologist.  He has [employer], uses it 5d/wk (20 mins straight, keeps heart rate up)  Other doctors caring for patient include: Ophtho: Dr. Satira Sark Derm: Dr. Ubaldo Glassing Dentist: Dr. Osa Craver GI:  Dr. Erlene Quan ([facility], in [place])  Depression screen:  See scanned questionnaire.  Notable for fatigue and decreased appetite--related to recent illness, which included GI symptoms.  Resolved now. ADL screen:  See scanned questionnaire.  Notable only for occasional ringing in ears.  End of Life Discussion:  Patient has a living will and medical power of attorney  Past Medical History  Diagnosis Date  . Arthritis     ?told when much younger, not aware of a particular joint  . Gastric ulcer     history of  . Allergy   . Chronic sinusitis   . Colon polyps 03/29/2009    adenomatous-last colonoscopy done 03/29/2009  . Diverticulosis     moderate  . CTS (carpal tunnel syndrome)   . Elevated PSA     per urology as long as it stays under 3-3.5 and on flat/stable curve no need for further workup.  Marland Kitchen Hyperlipidemia     borderline.  . Torn meniscus 2013    R knee    Past Surgical History  Procedure Laterality Date  . Lumbar laminectomy  1984  .  Tonsillectomy  child  . Colonoscopy  03/2009    in [place]; adenomatous polyp; due again 03/2014    History   Social History  . Marital Status: Married    Spouse Name: N/A    Number of Children: 1  . Years of Education: N/A   Occupational History  . Truck driver    Social History Main Topics  . Smoking status: Former Smoker -- 1.00 packs/day for 40 years    Types: Cigarettes    Quit date: 03/29/2013  . Smokeless tobacco: Never Used     Comment: has quit date set for 04/29/12  . Alcohol Use: 0.0 oz/week    0 Not specified per week     Comment:  1-2 drinks per day (on average, some days without any)  . Drug Use: No  . Sexual Activity: Not Currently   Other Topics Concern  . Not on file   Social History Narrative   Married. No pets.  Daughter lives in [place]    Family History  Problem Relation Age of Onset  . Heart attack Mother   . Stroke Father   . Hypertension Father   . Cancer Father     prostate in his late 69's  . Heart attack Brother   . Diabetes Neg Hx     Outpatient Encounter Prescriptions as of 04/11/2014  Medication Sig  . aspirin 81 MG tablet Take 81 mg by mouth daily.  Marland Kitchen lisinopril (PRINIVIL,ZESTRIL) 10 MG tablet Take 1 tablet (10 mg total) by mouth daily.  . Multiple Vitamins-Minerals (MULTIVITAMIN WITH MINERALS) tablet Take 1 tablet by mouth daily.  . vitamin C (ASCORBIC ACID) 500 MG tablet Take 500 mg by mouth daily.    Allergies  Allergen Reactions  . Ibuprofen Itching  . Penicillins     Pruritis, joint pains,throat swelling when he took Augmentin  . Sulfa Antibiotics Rash   ROS: The patient denies fever, chills, weight changes (gained up to 132 after quitting smoking, but it has come down).  Denies headaches, vision loss, decreased hearing, ear pain, chest pain, palpitations, dizziness, syncope, dyspnea on exertion, cough, swelling, constipation, abdominal pain, melena, hematochezia, hematuria, incontinence, erectile dysfunction, nocturia, weakened urine stream (sometimes sees a double/split stream, intermittent x years), dysuria, genital lesions, weakness, tremor, suspicious skin lesions, depression, anxiety, abnormal bleeding/bruising, or enlarged lymph nodes. Wears braces at night bilaterally for CTS--has tingling/numbness occasionally even when wearing braces. He denies any pain. Some pains behind his ears, neck and jaw, dentist told him everything was fine, might be muscular. Chronic PND and throat problems.  See HPI for acute symptoms. Some trouble sleeping, chronic   PHYSICAL EXAM:  BP  150/80 mmHg  Pulse 76  Temp(Src) 96.9 F (36.1 C) (Tympanic)  Ht _0  (1.6 m)  Wt 124 lb (56.246 kg)  BMI 21.97 kg/m2 136/80 on repeat by MD  General Appearance:  Alert, cooperative, no distress, appears stated age   Head:  Normocephalic, without obvious abnormality, atraumatic   Eyes:  PERRL, conjunctiva/corneas clear, EOM's intact, fundi  benign   Ears:  Normal TM's and external ear canals  Nose:  Nares normal, mucosa normal, no drainage or sinus tenderness   Throat:  Lips, mucosa, and tongue normal; teeth and gums normal. OP is without erythema, exudates, lesions  Neck:  Supple, no lymphadenopathy; thyroid: no enlargement/tenderness/nodules; no carotid  bruit or JVD.   Back:  Spine nontender, no curvature, ROM normal, no CVA tenderness   Lungs:  Clear to auscultation bilaterally without  wheezes, rales or ronchi; respirations unlabored   Chest Wall:  No tenderness or deformity   Heart:  Regular rate and rhythm, S1 and S2 normal, no murmur, rub or gallop   Breast Exam:  No chest wall tenderness, masses or gynecomastia   Abdomen:  Soft, non-tender, nondistended, normoactive bowel sounds,  no masses, no hepatosplenomegaly   Genitalia:  Normal male external genitalia without lesions. Testicles without masses. No inguinal hernias.   Rectal:  Normal sphincter tone, no masses or tenderness; guaiac negative stool. Prostate smooth, no nodules, not enlarged.   Extremities:  No clubbing, cyanosis or edema   Pulses:  2+ and symmetric all extremities   Skin:  Skin color, texture, turgor normal, no rashes or lesions   Lymph nodes:  Cervical, supraclavicular, and axillary nodes normal   Neurologic:  CNII-XII intact, normal strength, sensation and gait; reflexes 2+ and symmetric throughout   Psych: Normal mood, affect, hygiene and grooming.    Urine dipstick--NORMAL, no protein.   ASSESSMENT/PLAN:  Welcome to Medicare  preventive visit - Plan: POCT Urinalysis Dipstick, Visual acuity screening, EKG 12-Lead  Immunization due - Plan: Pneumococcal conjugate vaccine 13-valent  Impaired fasting glucose - Plan: Hemoglobin A1c  Pure hypercholesterolemia - LDL 109 in the past; recheck given new h/o HTN, as well as IFG - Plan: Comprehensive metabolic panel  Essential hypertension, benign - improved on repeat (30 mins after taking lisinopril).  f/u in 3 mos; plan to do b-met at visit - Plan: Lipid panel, Comprehensive metabolic panel  Screening for prostate cancer - Plan: PSA, Medicare  Weight gain - Plan: TSH  Medication monitoring encounter - Plan: Comprehensive metabolic panel, CBC with Differential  Sore throat - no e/o bacterial infection. Ddx includes viral, PND and reflux.  consider treatment for the latter 2 (discussed OTC PPI + antihistamine)   Discussed PSA screening (risks/benefits)--he has had elevated/borderline PSA's in past (saw urologist 4-5 years ago, told all was ok). Recommended at least 30 minutes of aerobic activity at least 5 days/week; proper sunscreen use reviewed; healthy diet and alcohol recommendations (less than or equal to 2 drinks/day) reviewed; regular seatbelt use; changing batteries in smoke detectors. Self-testicular exams. Immunization recommendations discussed--Prevnar 13 today. Colonoscopy recommendations reviewed--due now/scheduled.  Medicare Attestation I have personally reviewed: The patient's medical and social history Their use of alcohol, tobacco or illicit drugs Their current medications and supplements The patient's functional ability including ADLs,fall risks, home safety risks, cognitive, and hearing and visual impairment Diet and physical activities Evidence for depression or mood disorders  The patient's weight, height, BMI, and visual acuity have been recorded in the chart.  I have made referrals, counseling, and provided education to the patient based on review  of the above and I have provided the patient with a written personalized care plan for preventive services.     Kyren Knick A, MD   04/11/2014

## 2014-04-11 NOTE — Patient Instructions (Signed)
  HEALTH MAINTENANCE RECOMMENDATIONS:  It is recommended that you get at least 30 minutes of aerobic exercise at least 5 days/week (for weight loss, you may need as much as 60-90 minutes). This can be any activity that gets your heart rate up. This can be divided in 10-15 minute intervals if needed, but try and build up your endurance at least once a week.  Weight bearing exercise is also recommended twice weekly.  Eat a healthy diet with lots of vegetables, fruits and fiber.  "Colorful" foods have a lot of vitamins (ie green vegetables, tomatoes, red peppers, etc).  Limit sweet tea, regular sodas and alcoholic beverages, all of which has a lot of calories and sugar.  Up to 2 alcoholic drinks daily may be beneficial for men (unless trying to lose weight, watch sugars).  Drink a lot of water.  Sunscreen of at least SPF 30 should be used on all sun-exposed parts of the skin when outside between the hours of 10 am and 4 pm (not just when at beach or pool, but even with exercise, golf, tennis, and yard work!)  Use a sunscreen that says "broad spectrum" so it covers both UVA and UVB rays, and make sure to reapply every 1-2 hours.  Remember to change the batteries in your smoke detectors when changing your clock times in the spring and fall.  Use your seat belt every time you are in a car, and please drive safely and not be distracted with cell phones and texting while driving.  Continue to use saline nasal spray.  Try claritin, along with treating reflux for the next week.  Follow up with ENT if your throat pain doesn't improve.

## 2014-04-12 LAB — COMPREHENSIVE METABOLIC PANEL
ALBUMIN: 4.5 g/dL (ref 3.5–5.2)
ALK PHOS: 68 U/L (ref 39–117)
ALT: 18 U/L (ref 0–53)
AST: 22 U/L (ref 0–37)
BILIRUBIN TOTAL: 0.7 mg/dL (ref 0.2–1.2)
BUN: 12 mg/dL (ref 6–23)
CO2: 25 mEq/L (ref 19–32)
CREATININE: 0.84 mg/dL (ref 0.50–1.35)
Calcium: 9.3 mg/dL (ref 8.4–10.5)
Chloride: 101 mEq/L (ref 96–112)
Glucose, Bld: 102 mg/dL — ABNORMAL HIGH (ref 70–99)
Potassium: 4.1 mEq/L (ref 3.5–5.3)
SODIUM: 138 meq/L (ref 135–145)
TOTAL PROTEIN: 6.9 g/dL (ref 6.0–8.3)

## 2014-04-12 LAB — CBC WITH DIFFERENTIAL/PLATELET
BASOS ABS: 0.1 10*3/uL (ref 0.0–0.1)
Basophils Relative: 1 % (ref 0–1)
Eosinophils Absolute: 0.2 10*3/uL (ref 0.0–0.7)
Eosinophils Relative: 3 % (ref 0–5)
HEMATOCRIT: 45.3 % (ref 39.0–52.0)
Hemoglobin: 15 g/dL (ref 13.0–17.0)
LYMPHS ABS: 1.7 10*3/uL (ref 0.7–4.0)
Lymphocytes Relative: 32 % (ref 12–46)
MCH: 30.3 pg (ref 26.0–34.0)
MCHC: 33.1 g/dL (ref 30.0–36.0)
MCV: 91.5 fL (ref 78.0–100.0)
MONOS PCT: 11 % (ref 3–12)
MPV: 9.5 fL (ref 9.4–12.4)
Monocytes Absolute: 0.6 10*3/uL (ref 0.1–1.0)
NEUTROS ABS: 2.8 10*3/uL (ref 1.7–7.7)
Neutrophils Relative %: 53 % (ref 43–77)
Platelets: 266 10*3/uL (ref 150–400)
RBC: 4.95 MIL/uL (ref 4.22–5.81)
RDW: 13.6 % (ref 11.5–15.5)
WBC: 5.3 10*3/uL (ref 4.0–10.5)

## 2014-04-12 LAB — HEMOGLOBIN A1C
HEMOGLOBIN A1C: 5.8 % — AB (ref <5.7)
Mean Plasma Glucose: 120 mg/dL — ABNORMAL HIGH (ref <117)

## 2014-04-12 LAB — POCT URINALYSIS DIPSTICK
BILIRUBIN UA: NEGATIVE
Blood, UA: NEGATIVE
Glucose, UA: NEGATIVE
KETONES UA: NEGATIVE
Leukocytes, UA: NEGATIVE
NITRITE UA: NEGATIVE
SPEC GRAV UA: 1.02
UROBILINOGEN UA: NEGATIVE
pH, UA: 6

## 2014-04-12 LAB — LIPID PANEL
CHOLESTEROL: 177 mg/dL (ref 0–200)
HDL: 69 mg/dL (ref >39)
LDL Cholesterol: 98 mg/dL (ref 0–99)
TRIGLYCERIDES: 48 mg/dL (ref <150)
Total CHOL/HDL Ratio: 2.6 Ratio
VLDL: 10 mg/dL (ref 0–40)

## 2014-04-12 LAB — PSA, MEDICARE: PSA: 4.54 ng/mL — ABNORMAL HIGH (ref <=4.00)

## 2014-04-12 LAB — TSH: TSH: 2.054 u[IU]/mL (ref 0.350–4.500)

## 2014-04-13 LAB — CP2131 PSA, TOTAL AND FREE
PSA, Free Pct: 14 % — ABNORMAL LOW (ref >25)
PSA, Free: 0.65 ng/mL
PSA: 4.54 ng/mL — ABNORMAL HIGH (ref <=4.00)

## 2014-04-15 ENCOUNTER — Encounter: Payer: Self-pay | Admitting: Family Medicine

## 2014-04-27 LAB — HM COLONOSCOPY

## 2014-05-04 ENCOUNTER — Encounter: Payer: Self-pay | Admitting: *Deleted

## 2014-05-09 ENCOUNTER — Encounter: Payer: Self-pay | Admitting: Family Medicine

## 2014-05-27 ENCOUNTER — Telehealth: Payer: Self-pay | Admitting: Family Medicine

## 2014-05-27 NOTE — Telephone Encounter (Signed)
Requesting 3 month supply of Lisinopril 10MG 

## 2014-05-30 MED ORDER — LISINOPRIL 10 MG PO TABS: 10.0000 mg | ORAL_TABLET | Freq: Every day | ORAL | Status: DC

## 2014-05-30 NOTE — Telephone Encounter (Signed)
Done

## 2014-07-13 ENCOUNTER — Encounter: Payer: Self-pay | Admitting: Family Medicine

## 2014-07-13 ENCOUNTER — Ambulatory Visit (INDEPENDENT_AMBULATORY_CARE_PROVIDER_SITE_OTHER): Payer: Medicare HMO | Admitting: Family Medicine

## 2014-07-13 VITALS — BP 124/74 | HR 84 | Ht 63.0 in | Wt 123.4 lb

## 2014-07-13 DIAGNOSIS — I10 Essential (primary) hypertension: Secondary | ICD-10-CM

## 2014-07-13 DIAGNOSIS — Z5181 Encounter for therapeutic drug level monitoring: Secondary | ICD-10-CM

## 2014-07-13 LAB — BASIC METABOLIC PANEL
BUN: 16 mg/dL (ref 6–23)
CALCIUM: 8.9 mg/dL (ref 8.4–10.5)
CO2: 24 mEq/L (ref 19–32)
Chloride: 107 mEq/L (ref 96–112)
Creat: 0.99 mg/dL (ref 0.50–1.35)
GLUCOSE: 100 mg/dL — AB (ref 70–99)
Potassium: 4.1 mEq/L (ref 3.5–5.3)
SODIUM: 139 meq/L (ref 135–145)

## 2014-07-13 NOTE — Patient Instructions (Signed)
Continue your current medication.  Continue to periodically check your blood pressure.  Goal is to be 130/80 or less, definitely under 140/90. Return for you yearly exam in December, sooner if your blood pressures start running high

## 2014-07-13 NOTE — Progress Notes (Signed)
Chief Complaint  Patient presents with  . Hypertension    3 month follow up.    Patient presents for follow up on hypertension.  He was started on lisinopril in December.  He checks his blood pressure every 2 weeks, usually running 130-135/80 on average.  Denies any side effects or cough.  Denies headaches, dizziness, chest pain, palpitations, edema.  He is scheduled for prostate biopsy later this month, after having a high PSA at his physical.  He denies any symptoms or other concerns.  PMH, PSH, SH reviewed  Outpatient Encounter Prescriptions as of 07/13/2014  Medication Sig  . aspirin 81 MG tablet Take 81 mg by mouth daily.  Marland Kitchen lisinopril (PRINIVIL,ZESTRIL) 10 MG tablet Take 1 tablet (10 mg total) by mouth daily.  . Multiple Vitamins-Minerals (MULTIVITAMIN WITH MINERALS) tablet Take 1 tablet by mouth daily.  . vitamin C (ASCORBIC ACID) 500 MG tablet Take 500 mg by mouth daily.   Allergies  Allergen Reactions  . Ibuprofen Itching  . Penicillins     Pruritis, joint pains,throat swelling when he took Augmentin  . Sulfa Antibiotics Rash    ROS: no headaches, dizziness, chest pain, fever, chills, URI symptoms, cough, shortness of breath, GI or GU complaints, bleeding, bruising, rash, edema or other problems.  PHYSICAL EXAM: BP 140/70 mmHg  Pulse 84  Ht '5\' 3"'  (1.6 m)  Wt 123 lb 6.4 oz (55.974 kg)  BMI 21.86 kg/m2 124/74 on repeat by MD Well developed, pleasant male in no distress Neck: no lymphadenopathy, thyromegaly or carotid bruit Heart: regular rate and rhythm without murmur Lungs: clear bilaterally Extremities: no edema  ASSESSMENT/PLAN:  Essential hypertension, benign - Plan: Basic metabolic panel  Medication monitoring encounter - Plan: Basic metabolic panel  HTN--controlled on lisinopril.  Check b-met.  Continue to monitor.  Return sooner than CPE in December if BP's running high, or other concerns develop.  Discussed again the possible outcomes of prostate  biopsy

## 2014-07-20 HISTORY — PX: PROSTATE BIOPSY: SHX241

## 2014-07-25 ENCOUNTER — Encounter: Payer: Self-pay | Admitting: Family Medicine

## 2014-07-25 DIAGNOSIS — C61 Malignant neoplasm of prostate: Secondary | ICD-10-CM | POA: Insufficient documentation

## 2014-08-02 ENCOUNTER — Encounter: Payer: Self-pay | Admitting: Family Medicine

## 2014-08-18 ENCOUNTER — Telehealth: Payer: Self-pay | Admitting: Family Medicine

## 2014-08-18 NOTE — Telephone Encounter (Signed)
Pt called and stated he was diagnosed with prostate cancer. He would like to speak to you concerning this. Please call pt at [phone].

## 2014-08-18 NOTE — Telephone Encounter (Signed)
Spoke with pt--told him that we had gotten the notes faxed already from Dr. Diona Fanti.  He hasn't yet heard from radiation oncologist for consultation, but expects to soon.

## 2014-08-27 ENCOUNTER — Other Ambulatory Visit: Payer: Self-pay | Admitting: Family Medicine

## 2014-08-29 ENCOUNTER — Telehealth: Payer: Self-pay | Admitting: Family Medicine

## 2014-08-29 NOTE — Telephone Encounter (Signed)
error 

## 2014-08-31 ENCOUNTER — Encounter: Payer: Self-pay | Admitting: Radiation Oncology

## 2014-08-31 NOTE — Progress Notes (Addendum)
GU Location of Tumor / Histology: Adenocarcinoma of the Prostate    If Prostate Cancer, Gleason Score is (3 + 3), 5 positivive cores on the left and 2 on the right and PSA is (4.54)   Mr. Marylynn Pearson     Past/Anticipated interventions by urology, if any: Dr. Franchot Gallo - Biopsy of the Prostate  Past/Anticipated interventions by medical oncology, if any: None at this time  Weight changes, if any:no  Bowel/Bladder complaints, if any: IPSS score is 6.  Denies bowel issues.  SHIM Score 20  Nausea/Vomiting, if any: no  Pain issues, if any:  Has pain in his neck and throat, for about 10 years, mostly at night which is keeping him awake.  He has been told that it may be acid reflux.  He is taking Nexium.  Sexual Function: "Good", but not sexually active  SAFETY ISSUES:  Prior radiation? No  Pacemaker/ICD? No  Possible current pregnancy? N/A  Is the patient on methotrexate? No  Current Complaints / other details:  Patient is here with his wife.    BP 129/74 mmHg  Pulse 76  Temp(Src) 98 F (36.7 C) (Oral)  Resp 16  Ht 5\' 4"  (1.626 m)  Wt 127 lb 11.2 oz (57.924 kg)  BMI 21.91 kg/m2

## 2014-09-01 ENCOUNTER — Encounter: Payer: Self-pay | Admitting: Radiation Oncology

## 2014-09-01 ENCOUNTER — Ambulatory Visit
Admission: RE | Admit: 2014-09-01 | Discharge: 2014-09-01 | Disposition: A | Discharge status: Home / Self Care | Payer: Medicare HMO | Source: Ambulatory Visit | Attending: Radiation Oncology | Admitting: Radiation Oncology

## 2014-09-01 VITALS — BP 129/74 | HR 76 | Temp 98.0°F | Resp 16 | Ht 64.0 in | Wt 127.7 lb

## 2014-09-01 DIAGNOSIS — C61 Malignant neoplasm of prostate: Secondary | ICD-10-CM

## 2014-09-01 NOTE — Progress Notes (Signed)
[facility] NEW PATIENT EVALUATION  Name: Gary Blair MRN: [id number]  Date:   09/01/2014           DOB: [birth year]  Status: outpatient   CC: Vikki Ports, MD  Franchot Gallo, MD    REFERRING PHYSICIAN: Franchot Gallo, MD   DIAGNOSIS:  Stage TIc favorable risk adenocarcinoma prostate  HISTORY OF PRESENT ILLNESS:  Gary Blair is a 64 y.o. male who is seen today through the courtesy of Dr. Diona Fanti for discussion of possible radiation therapy in the management of his stage TIc favorable risk adenocarcinoma prostate.  I understand that his PSA was below 2 a few years ago.  It rose to 4.4 and then fell back down to 2.7.  More recently it rose to 4.54 this past December.  He was seen by Dr. Diona Fanti who performed ultrasound-guided biopsies on 07/20/2014.  He was found to have Gleason 6 (3+3) involving 60% of one core from right lateral base, 20% of one core from right lateral apex, 90% of one core from the left lateral base, 50% of one core from the left base, 10% of one core from the left lateral mid gland, 50% of one core from the left lateral apex and 50% of one core from left apex.  His gland volume was 25.8 mL.  He is doing well from a GU and GI standpoint.  His I PSS score is 6.  He is potent, but he is not sexually active.  PREVIOUS RADIATION THERAPY: No   PAST MEDICAL HISTORY:  has a past medical history of Arthritis; Gastric ulcer; Allergy; Chronic sinusitis; Colon polyps (03/29/2009); Diverticulosis; CTS (carpal tunnel syndrome); Elevated PSA; Hyperlipidemia; Torn meniscus (2013); and Prostate cancer (06/2014).     PAST SURGICAL HISTORY:  Past Surgical History  Procedure Laterality Date  . Cervical laminectomy  1984  . Tonsillectomy  child  . Colonoscopy  03/2009    in [place]; adenomatous polyp; due again 03/2014  . Prostate biopsy  07/20/14  . Colonoscopy  2015     FAMILY HISTORY: family history includes Heart attack in his  brother and mother; Hypertension in his father; Prostate cancer in his father; Stroke in his father. There is no history of Diabetes.  His father died from complications of diabetes and a CVA at age 33.  He was diagnosed with prostate cancer in his 89s.  His mother died of a heart attack at age 15.   SOCIAL HISTORY:  reports that he quit smoking about 17 months ago. His smoking use included Cigarettes. He has a 40 pack-year smoking history. He has never used smokeless tobacco. He reports that he drinks alcohol. He reports that he does not use illicit drugs.  Married, one child.  He works driving a truck.   ALLERGIES: Ibuprofen; Penicillins; and Sulfa antibiotics   MEDICATIONS:  Current Outpatient Prescriptions  Medication Sig Dispense Refill  . aspirin 81 MG tablet Take 81 mg by mouth daily.    Marland Kitchen esomeprazole (NEXIUM) 20 MG capsule Take 20 mg by mouth daily at 12 noon.    Marland Kitchen lisinopril (PRINIVIL,ZESTRIL) 10 MG tablet TAKE ONE TABLET BY MOUTH ONCE DAILY. 90 tablet 0  . Multiple Vitamins-Minerals (MULTIVITAMIN WITH MINERALS) tablet Take 1 tablet by mouth daily.    . vitamin C (ASCORBIC ACID) 500 MG tablet Take 500 mg by mouth daily.     No current facility-administered medications for this encounter.     REVIEW OF SYSTEMS:  Pertinent items  are noted in HPI.    PHYSICAL EXAM:  height is 5\' 4"  (1.626 m) and weight is 127 lb 11.2 oz (57.924 kg). His oral temperature is 98 F (36.7 C). His blood pressure is 129/74 and his pulse is 76. His respiration is 16.   Alert and oriented 64 year old male appearing his stated age.  Head and neck examination: Grossly unremarkable.  Nodes: Without palpable cervical or supraclavicular lymphadenopathy.  Chest: Lungs clear.  Abdomen: Without masses organomegaly.  Genitalia: Unremarkable to inspection.  Rectal: The prostate gland is small and is without focal induration or nodularity.  Extremities: Without edema.   LABORATORY DATA:  Lab Results  Component  Value Date   WBC 5.3 04/11/2014   HGB 15.0 04/11/2014   HCT 45.3 04/11/2014   MCV 91.5 04/11/2014   PLT 266 04/11/2014   Lab Results  Component Value Date   NA 139 07/13/2014   K 4.1 07/13/2014   CL 107 07/13/2014   CO2 24 07/13/2014   Lab Results  Component Value Date   ALT 18 04/11/2014   AST 22 04/11/2014   ALKPHOS 68 04/11/2014   BILITOT 0.7 04/11/2014   PSA 4.54 from 04/11/2014   IMPRESSION: Stage TIc favorable risk adenocarcinoma prostate.  I explained to the patient and his wife that his prognosis is related to his stage, Gleason score, and PSA level.  All are favorable.  However, he does have moderate volume disease.  We discussed management options including robotic surgery, active surveillance, and radiation therapy.  Radiation therapy options include seed implantation alone or 8 weeks of external beam/IMRT.  We discussed seed implantation in great detail.  He does have good urinary function and his gland volume is certainly acceptable.  We also discussed radiation safety issues regarding seed implantation.  We also discussed 8 weeks of external beam/IMRT in great detail including the need for placement of 3 gold seed markers for image guidance and bladder filling during his treatment to minimize urinary toxicity.  He spends a lot of his time driving a truck, and external beam radiation therapy or surgery would probably be problematic from a work standpoint.  He will think things over and contact me if he would like to proceed with radiation therapy.  He knows that we will need to do a CT arch study to confirm his prostate volume and check his pubic arch if he chooses seed implantation.   PLAN: As discussed above.  He tells me he will call me back within a week.  I spent 60 minutes face to face with the patient and more than 50% of that time was spent in counseling and/or coordination of care.

## 2014-09-01 NOTE — Progress Notes (Signed)
Please see the Nurse Progress Note in the MD Initial Consult Encounter for this patient. 

## 2014-09-05 ENCOUNTER — Encounter: Payer: Self-pay | Admitting: Radiation Oncology

## 2014-09-05 ENCOUNTER — Telehealth: Payer: Self-pay | Admitting: *Deleted

## 2014-09-05 NOTE — Telephone Encounter (Signed)
Called patient to inform of pre-seed appt. For 09-15-14 - arrival time - 9 am, spoke with patient and he is aware of this appt.

## 2014-09-05 NOTE — Progress Notes (Signed)
CC: Dr. Franchot Gallo, Gary Blair  Gary Blair left a message stating that he wanted to proceed with seed implantation which I think would be a good choice for him.  We will get him scheduled with Dr. Diona Fanti after his CT arch study.

## 2014-09-14 ENCOUNTER — Telehealth: Payer: Self-pay | Admitting: *Deleted

## 2014-09-14 NOTE — Telephone Encounter (Signed)
Called patient to inform of pre-seed scan, confirmed appt. with patient.

## 2014-09-15 ENCOUNTER — Ambulatory Visit
Admission: RE | Admit: 2014-09-15 | Discharge: 2014-09-15 | Disposition: A | Discharge status: Home / Self Care | Payer: Medicare HMO | Source: Ambulatory Visit | Attending: Radiation Oncology | Admitting: Radiation Oncology

## 2014-09-15 DIAGNOSIS — C61 Malignant neoplasm of prostate: Secondary | ICD-10-CM

## 2014-09-15 NOTE — Progress Notes (Signed)
CC: Dr. Franchot Gallo   CT simulation/treatment planning note:   Gary Blair was taken to the CT simulator.  His pelvis was scanned.  The CT data set was sent to the planning system where contoured his prostate.  His prostate volume is measured to be 27.4 mL where Dr. Diona Fanti measured 25.8 mL, a good correlation.  His arch is open.  He is a candidate for seed implantation.  I prescribing 14,500 cGy utilizing I-125 seeds.  He will be implanted with Nucletron seed Selectron system.

## 2014-09-16 ENCOUNTER — Telehealth: Payer: Self-pay | Admitting: *Deleted

## 2014-09-16 NOTE — Telephone Encounter (Signed)
Called patient to inform of implant date, lvm for a return call 

## 2014-09-19 ENCOUNTER — Other Ambulatory Visit: Payer: Self-pay | Admitting: Urology

## 2014-09-19 ENCOUNTER — Telehealth: Payer: Self-pay | Admitting: *Deleted

## 2014-09-19 NOTE — Telephone Encounter (Signed)
Called patient to inform of implant date, spoke with patient and he is aware of this.

## 2014-09-22 ENCOUNTER — Ambulatory Visit (HOSPITAL_COMMUNITY)
Admission: RE | Admit: 2014-09-22 | Discharge: 2014-09-22 | Disposition: A | Discharge status: Home / Self Care | Payer: Medicare HMO | Source: Ambulatory Visit | Attending: Urology | Admitting: Urology

## 2014-09-22 DIAGNOSIS — C61 Malignant neoplasm of prostate: Secondary | ICD-10-CM | POA: Diagnosis not present

## 2014-10-19 ENCOUNTER — Telehealth: Payer: Self-pay | Admitting: *Deleted

## 2014-10-19 NOTE — Telephone Encounter (Signed)
Called patient to remind of labs for procedure on 10-27-14, lvm for a return call

## 2014-10-20 DIAGNOSIS — Z87891 Personal history of nicotine dependence: Secondary | ICD-10-CM | POA: Diagnosis not present

## 2014-10-20 DIAGNOSIS — Z8042 Family history of malignant neoplasm of prostate: Secondary | ICD-10-CM | POA: Diagnosis not present

## 2014-10-20 DIAGNOSIS — I10 Essential (primary) hypertension: Secondary | ICD-10-CM | POA: Diagnosis not present

## 2014-10-20 DIAGNOSIS — Z79899 Other long term (current) drug therapy: Secondary | ICD-10-CM | POA: Diagnosis not present

## 2014-10-20 DIAGNOSIS — K219 Gastro-esophageal reflux disease without esophagitis: Secondary | ICD-10-CM | POA: Diagnosis not present

## 2014-10-20 DIAGNOSIS — M1990 Unspecified osteoarthritis, unspecified site: Secondary | ICD-10-CM | POA: Diagnosis not present

## 2014-10-20 DIAGNOSIS — C61 Malignant neoplasm of prostate: Secondary | ICD-10-CM | POA: Diagnosis not present

## 2014-10-20 DIAGNOSIS — Z7982 Long term (current) use of aspirin: Secondary | ICD-10-CM | POA: Diagnosis not present

## 2014-10-20 LAB — COMPREHENSIVE METABOLIC PANEL
ALT: 20 U/L (ref 17–63)
AST: 21 U/L (ref 15–41)
Albumin: 4 g/dL (ref 3.5–5.0)
Alkaline Phosphatase: 66 U/L (ref 38–126)
Anion gap: 7 (ref 5–15)
BILIRUBIN TOTAL: 0.7 mg/dL (ref 0.3–1.2)
BUN: 17 mg/dL (ref 6–20)
CO2: 27 mmol/L (ref 22–32)
Calcium: 9.1 mg/dL (ref 8.9–10.3)
Chloride: 106 mmol/L (ref 101–111)
Creatinine, Ser: 1.01 mg/dL (ref 0.61–1.24)
GFR calc Af Amer: >60 mL/min (ref >60)
Glucose, Bld: 109 mg/dL — ABNORMAL HIGH (ref 65–99)
POTASSIUM: 4.6 mmol/L (ref 3.5–5.1)
SODIUM: 140 mmol/L (ref 135–145)
Total Protein: 6.7 g/dL (ref 6.5–8.1)

## 2014-10-20 LAB — PROTIME-INR
INR: 1 (ref 0.00–1.49)
Prothrombin Time: 13.4 seconds (ref 11.6–15.2)

## 2014-10-20 LAB — CBC
HCT: 44 % (ref 39.0–52.0)
HEMOGLOBIN: 14.5 g/dL (ref 13.0–17.0)
MCH: 30.5 pg (ref 26.0–34.0)
MCHC: 33 g/dL (ref 30.0–36.0)
MCV: 92.6 fL (ref 78.0–100.0)
PLATELETS: 213 10*3/uL (ref 150–400)
RBC: 4.75 MIL/uL (ref 4.22–5.81)
RDW: 13.1 % (ref 11.5–15.5)
WBC: 4.3 10*3/uL (ref 4.0–10.5)

## 2014-10-20 LAB — APTT: APTT: 28 s (ref 24–37)

## 2014-10-21 ENCOUNTER — Encounter (HOSPITAL_BASED_OUTPATIENT_CLINIC_OR_DEPARTMENT_OTHER): Payer: Self-pay | Admitting: *Deleted

## 2014-10-24 ENCOUNTER — Encounter (HOSPITAL_BASED_OUTPATIENT_CLINIC_OR_DEPARTMENT_OTHER): Payer: Self-pay | Admitting: *Deleted

## 2014-10-25 ENCOUNTER — Encounter (HOSPITAL_BASED_OUTPATIENT_CLINIC_OR_DEPARTMENT_OTHER): Payer: Self-pay | Admitting: *Deleted

## 2014-10-25 NOTE — Progress Notes (Signed)
   10/25/14 0927  OBSTRUCTIVE SLEEP APNEA  Have you ever been diagnosed with sleep apnea through a sleep study? No  Do you snore loudly (loud enough to be heard through closed doors)?  1  Do you often feel tired, fatigued, or sleepy during the daytime? 0  Has anyone observed you stop breathing during your sleep? 0  Do you have, or are you being treated for high blood pressure? 1  BMI more than 35 kg/m2? 0  Age over 66 years old? 1  Neck circumference greater than 40 cm/16 inches? 0  Gender: 1  Obstructive Sleep Apnea Score 4

## 2014-10-25 NOTE — Progress Notes (Signed)
NPO AFTER MN.  ARRIVE AT 0600.  CURRENT LAB RESULTS, EKG AND CXR IN CHART AND EPIC.  WILL TAKE DO FLEET ENEMA AM DOS.

## 2014-10-26 ENCOUNTER — Telehealth: Payer: Self-pay | Admitting: *Deleted

## 2014-10-26 NOTE — Telephone Encounter (Signed)
Called patient to remind of procedure for 10-27-14, spoke with patient and he is aware of this procedure

## 2014-10-27 ENCOUNTER — Ambulatory Visit (HOSPITAL_BASED_OUTPATIENT_CLINIC_OR_DEPARTMENT_OTHER): Payer: Medicare HMO | Admitting: Anesthesiology

## 2014-10-27 ENCOUNTER — Ambulatory Visit (HOSPITAL_BASED_OUTPATIENT_CLINIC_OR_DEPARTMENT_OTHER)
Admission: RE | Admit: 2014-10-27 | Discharge: 2014-10-27 | Disposition: A | Discharge status: Home / Self Care | Payer: Medicare HMO | Source: Ambulatory Visit | Attending: Urology | Admitting: Urology

## 2014-10-27 ENCOUNTER — Encounter (HOSPITAL_BASED_OUTPATIENT_CLINIC_OR_DEPARTMENT_OTHER): Payer: Self-pay

## 2014-10-27 ENCOUNTER — Ambulatory Visit (HOSPITAL_COMMUNITY): Payer: Medicare HMO

## 2014-10-27 ENCOUNTER — Encounter (HOSPITAL_BASED_OUTPATIENT_CLINIC_OR_DEPARTMENT_OTHER)
Admission: RE | Disposition: A | Discharge status: Home / Self Care | Payer: Self-pay | Source: Ambulatory Visit | Attending: Urology

## 2014-10-27 ENCOUNTER — Encounter: Payer: Self-pay | Admitting: Radiation Oncology

## 2014-10-27 DIAGNOSIS — Z923 Personal history of irradiation: Secondary | ICD-10-CM

## 2014-10-27 DIAGNOSIS — Z7982 Long term (current) use of aspirin: Secondary | ICD-10-CM | POA: Insufficient documentation

## 2014-10-27 DIAGNOSIS — C61 Malignant neoplasm of prostate: Secondary | ICD-10-CM | POA: Diagnosis present

## 2014-10-27 DIAGNOSIS — Z01818 Encounter for other preprocedural examination: Secondary | ICD-10-CM

## 2014-10-27 DIAGNOSIS — M1990 Unspecified osteoarthritis, unspecified site: Secondary | ICD-10-CM | POA: Insufficient documentation

## 2014-10-27 DIAGNOSIS — Z8042 Family history of malignant neoplasm of prostate: Secondary | ICD-10-CM | POA: Diagnosis not present

## 2014-10-27 DIAGNOSIS — Z79899 Other long term (current) drug therapy: Secondary | ICD-10-CM | POA: Diagnosis not present

## 2014-10-27 DIAGNOSIS — I10 Essential (primary) hypertension: Secondary | ICD-10-CM | POA: Diagnosis not present

## 2014-10-27 DIAGNOSIS — Z87891 Personal history of nicotine dependence: Secondary | ICD-10-CM | POA: Diagnosis not present

## 2014-10-27 DIAGNOSIS — K219 Gastro-esophageal reflux disease without esophagitis: Secondary | ICD-10-CM | POA: Diagnosis not present

## 2014-10-27 HISTORY — DX: Personal history of peptic ulcer disease: Z87.11

## 2014-10-27 HISTORY — DX: Personal history of irradiation: Z92.3

## 2014-10-27 HISTORY — DX: Carpal tunnel syndrome, bilateral upper limbs: G56.03

## 2014-10-27 HISTORY — DX: Essential (primary) hypertension: I10

## 2014-10-27 HISTORY — DX: Personal history of adenomatous and serrated colon polyps: Z86.0101

## 2014-10-27 HISTORY — DX: Hyperlipidemia, unspecified: E78.5

## 2014-10-27 HISTORY — DX: Diverticulosis of large intestine without perforation or abscess without bleeding: K57.30

## 2014-10-27 HISTORY — DX: Personal history of colonic polyps: Z86.010

## 2014-10-27 HISTORY — DX: Other specified personal risk factors, not elsewhere classified: Z91.89

## 2014-10-27 HISTORY — PX: RADIOACTIVE SEED IMPLANT: SHX5150

## 2014-10-27 HISTORY — DX: Personal history of other diseases of the digestive system: Z87.19

## 2014-10-27 HISTORY — DX: Presence of spectacles and contact lenses: Z97.3

## 2014-10-27 HISTORY — DX: Personal history of other diseases of the respiratory system: Z87.09

## 2014-10-27 SURGERY — INSERTION, RADIATION SOURCE, PROSTATE
Anesthesia: General | Site: Prostate

## 2014-10-27 MED ORDER — LIDOCAINE HCL 4 % MT SOLN
OROMUCOSAL | Status: DC | PRN
  Administered 2014-10-27: 3 mL via TOPICAL

## 2014-10-27 MED ORDER — EPHEDRINE SULFATE 50 MG/ML IJ SOLN
INTRAMUSCULAR | Status: DC | PRN
  Administered 2014-10-27 (×2): 10 mg via INTRAVENOUS
  Administered 2014-10-27: 15 mg via INTRAVENOUS

## 2014-10-27 MED ORDER — DEXAMETHASONE SODIUM PHOSPHATE 4 MG/ML IJ SOLN
INTRAMUSCULAR | Status: DC | PRN
  Administered 2014-10-27: 10 mg via INTRAVENOUS

## 2014-10-27 MED ORDER — ONDANSETRON HCL 4 MG/2ML IJ SOLN
4.0000 mg | Freq: Once | INTRAMUSCULAR | Status: DC | PRN
  Filled 2014-10-27: qty 2

## 2014-10-27 MED ORDER — LACTATED RINGERS IV SOLN
INTRAVENOUS | Status: DC
  Administered 2014-10-27 (×4): via INTRAVENOUS
  Filled 2014-10-27: qty 1000

## 2014-10-27 MED ORDER — MEPERIDINE HCL 25 MG/ML IJ SOLN
6.2500 mg | INTRAMUSCULAR | Status: DC | PRN
  Filled 2014-10-27: qty 1

## 2014-10-27 MED ORDER — CIPROFLOXACIN HCL 250 MG PO TABS: 250.0000 mg | ORAL_TABLET | Freq: Two times a day (BID) | ORAL | Status: DC

## 2014-10-27 MED ORDER — IOHEXOL 350 MG/ML SOLN
INTRAVENOUS | Status: DC | PRN
  Administered 2014-10-27: 7 mL

## 2014-10-27 MED ORDER — PHENYLEPHRINE HCL 10 MG/ML IJ SOLN
INTRAMUSCULAR | Status: DC | PRN
  Administered 2014-10-27: 40 ug via INTRAVENOUS
  Administered 2014-10-27 (×2): 80 ug via INTRAVENOUS

## 2014-10-27 MED ORDER — FENTANYL CITRATE (PF) 100 MCG/2ML IJ SOLN
INTRAMUSCULAR | Status: DC | PRN
  Administered 2014-10-27 (×2): 50 ug via INTRAVENOUS
  Administered 2014-10-27: 25 ug via INTRAVENOUS
  Administered 2014-10-27: 50 ug via INTRAVENOUS
  Administered 2014-10-27: 25 ug via INTRAVENOUS

## 2014-10-27 MED ORDER — MIDAZOLAM HCL 2 MG/2ML IJ SOLN
INTRAMUSCULAR | Status: AC
  Filled 2014-10-27: qty 2

## 2014-10-27 MED ORDER — FENTANYL CITRATE (PF) 100 MCG/2ML IJ SOLN
INTRAMUSCULAR | Status: AC
  Filled 2014-10-27: qty 4

## 2014-10-27 MED ORDER — STERILE WATER FOR IRRIGATION IR SOLN
Status: DC | PRN
  Administered 2014-10-27: 3 mL

## 2014-10-27 MED ORDER — ROCURONIUM BROMIDE 100 MG/10ML IV SOLN
INTRAVENOUS | Status: DC | PRN
  Administered 2014-10-27: 10 mg via INTRAVENOUS
  Administered 2014-10-27: 25 mg via INTRAVENOUS
  Administered 2014-10-27: 5 mg via INTRAVENOUS
  Administered 2014-10-27: 10 mg via INTRAVENOUS

## 2014-10-27 MED ORDER — ACETAMINOPHEN 10 MG/ML IV SOLN
INTRAVENOUS | Status: DC | PRN
  Administered 2014-10-27: 1000 mg via INTRAVENOUS

## 2014-10-27 MED ORDER — HYDROMORPHONE HCL 1 MG/ML IJ SOLN
0.2500 mg | INTRAMUSCULAR | Status: DC | PRN
  Filled 2014-10-27: qty 1

## 2014-10-27 MED ORDER — ONDANSETRON HCL 4 MG/2ML IJ SOLN
INTRAMUSCULAR | Status: DC | PRN
  Administered 2014-10-27: 4 mg via INTRAVENOUS

## 2014-10-27 MED ORDER — CIPROFLOXACIN IN D5W 400 MG/200ML IV SOLN
INTRAVENOUS | Status: AC
  Filled 2014-10-27: qty 200

## 2014-10-27 MED ORDER — HYDROCODONE-ACETAMINOPHEN 5-325 MG PO TABS: 1.0000 | ORAL_TABLET | ORAL | Status: DC | PRN

## 2014-10-27 MED ORDER — CIPROFLOXACIN IN D5W 400 MG/200ML IV SOLN
400.0000 mg | INTRAVENOUS | Status: AC
  Administered 2014-10-27: 400 mg via INTRAVENOUS
  Filled 2014-10-27: qty 200

## 2014-10-27 MED ORDER — SUCCINYLCHOLINE CHLORIDE 20 MG/ML IJ SOLN
INTRAMUSCULAR | Status: DC | PRN
  Administered 2014-10-27: 100 mg via INTRAVENOUS

## 2014-10-27 MED ORDER — MIDAZOLAM HCL 5 MG/5ML IJ SOLN
INTRAMUSCULAR | Status: DC | PRN
  Administered 2014-10-27: 2 mg via INTRAVENOUS

## 2014-10-27 MED ORDER — LIDOCAINE HCL (CARDIAC) 20 MG/ML IV SOLN
INTRAVENOUS | Status: DC | PRN
  Administered 2014-10-27: 50 mg via INTRAVENOUS

## 2014-10-27 MED ORDER — PROPOFOL 10 MG/ML IV BOLUS
INTRAVENOUS | Status: DC | PRN
  Administered 2014-10-27: 20 mg via INTRAVENOUS
  Administered 2014-10-27: 30 mg via INTRAVENOUS
  Administered 2014-10-27: 150 mg via INTRAVENOUS

## 2014-10-27 MED ORDER — FLEET ENEMA 7-19 GM/118ML RE ENEM
1.0000 | ENEMA | Freq: Once | RECTAL | Status: DC
  Filled 2014-10-27: qty 1

## 2014-10-27 SURGICAL SUPPLY — 31 items
BAG URINE DRAINAGE (UROLOGICAL SUPPLIES) ×2 IMPLANT
BLADE CLIPPER SURG (BLADE) ×2 IMPLANT
CATH FOLEY 2WAY SLVR  5CC 16FR (CATHETERS) ×1
CATH FOLEY 2WAY SLVR 5CC 16FR (CATHETERS) ×1 IMPLANT
CATH ROBINSON RED A/P 20FR (CATHETERS) ×2 IMPLANT
CLOTH BEACON ORANGE TIMEOUT ST (SAFETY) ×2 IMPLANT
COVER BACK TABLE 60X90IN (DRAPES) ×2 IMPLANT
COVER MAYO STAND STRL (DRAPES) ×2 IMPLANT
DRSG TEGADERM 4X4.75 (GAUZE/BANDAGES/DRESSINGS) ×2 IMPLANT
DRSG TEGADERM 8X12 (GAUZE/BANDAGES/DRESSINGS) ×2 IMPLANT
GLOVE BIO SURGEON STRL SZ 6.5 (GLOVE) ×1 IMPLANT
GLOVE BIO SURGEON STRL SZ7 (GLOVE) ×1 IMPLANT
GLOVE BIO SURGEON STRL SZ7.5 (GLOVE) ×5 IMPLANT
GLOVE BIO SURGEON STRL SZ8 (GLOVE) ×4 IMPLANT
GLOVE BIOGEL PI IND STRL 6.5 (GLOVE) IMPLANT
GLOVE BIOGEL PI INDICATOR 6.5 (GLOVE) ×1
GLOVE ECLIPSE 8.0 STRL XLNG CF (GLOVE) IMPLANT
GLOVE INDICATOR 7.0 STRL GRN (GLOVE) ×1 IMPLANT
GLOVE SURG SS PI 7.0 STRL IVOR (GLOVE) ×2 IMPLANT
GOWN STRL REUS W/ TWL LRG LVL3 (GOWN DISPOSABLE) ×1 IMPLANT
GOWN STRL REUS W/ TWL XL LVL3 (GOWN DISPOSABLE) ×1 IMPLANT
GOWN STRL REUS W/TWL LRG LVL3 (GOWN DISPOSABLE) ×6
GOWN STRL REUS W/TWL XL LVL3 (GOWN DISPOSABLE) ×2
HOLDER FOLEY CATH W/STRAP (MISCELLANEOUS) ×2 IMPLANT
MANIFOLD NEPTUNE II (INSTRUMENTS) IMPLANT
Nucletron selectSeed ×1 IMPLANT
PACK CYSTO (CUSTOM PROCEDURE TRAY) ×2 IMPLANT
SYRINGE 10CC LL (SYRINGE) ×2 IMPLANT
UNDERPAD 30X30 INCONTINENT (UNDERPADS AND DIAPERS) ×4 IMPLANT
WATER STERILE IRR 3000ML UROMA (IV SOLUTION) ×2 IMPLANT
WATER STERILE IRR 500ML POUR (IV SOLUTION) ×2 IMPLANT

## 2014-10-27 NOTE — Progress Notes (Signed)
[facility] Brachytherapy Operative Procedure Note  Name: Gary Blair MRN: [id number]  Date:   10/27/2014           DOB: [birth year]  Status:outpatient    QV:ZDGLO,VFI A, MD  Dr. Franchot Gallo DIAGNOSIS: A 64 year old gentlemen with stage T1c adenocarcinoma of the prostate with a Gleason of 6 and a PSA of 4.54.  PROCEDURE: Insertion of radioactive I-125 seeds into the prostate gland.  RADIATION DOSE: 145 Gy, definitive therapy.  TECHNIQUE: Gary Blair was brought to the operating room with Dr. Diona Fanti. He was placed in the dorsolithotomy position. He was catheterized and a rectal tube was inserted. The perineum was shaved, prepped and draped. The ultrasound probe was then introduced into the rectum to see the prostate gland.  TREATMENT DEVICE: A needle grid was attached to the ultrasound probe stand and anchor needles were placed.  COMPLEX ISODOSE CALCULATION: The prostate was imaged in 3D using a sagittal sweep of the prostate probe. These images were transferred to the planning computer. There, the prostate, urethra and rectum were defined on each axial reconstructed image. Then, the software created an optimized plan and a few seed positions were adjusted. Then the accepted plan was uploaded to the seed Selectron afterloading unit.  SPECIAL TREATMENT PROCEDURE/SUPERVISION AND HANDLING: The Nucletron FIRST system was used to place the needles under sagittal guidance. A total of 31 needles were used to deposit 67 seeds in the prostate gland. The individual seed activity was 0.363 mCi for a total implant activity of 24.32 mCi.  COMPLEX SIMULATION: At the end of the procedure, an anterior radiograph of the pelvis was obtained to document seed positioning and count. Cystoscopy was performed to check the urethra and bladder.  MICRODOSIMETRY: At the end of the procedure, the patient was emitting 0.01 mrem/hr at 1 meter. Accordingly, he was considered  safe for hospital discharge.  PLAN: The patient will return to the radiation oncology clinic for post implant CT dosimetry in three weeks.

## 2014-10-27 NOTE — Transfer of Care (Signed)
Last Vitals:  Filed Vitals:   10/27/14 0622  BP: 133/74  Pulse: 98  Temp: 36.5 C  Resp: 16    Immediate Anesthesia Transfer of Care Note  Patient: Gary Blair  Procedure(s) Performed: Procedure(s) (LRB): RADIOACTIVE SEED IMPLANT/BRACHYTHERAPY IMPLANT (N/A)  Patient Location: PACU  Anesthesia Type: General  Level of Consciousness: awake, alert  and oriented  Airway & Oxygen Therapy: Patient Spontanous Breathing and Patient connected to face mask oxygen  Post-op Assessment: Report given to PACU RN and Post -op Vital signs reviewed and stable  Post vital signs: Reviewed and stable  Complications: No apparent anesthesia complications

## 2014-10-27 NOTE — Anesthesia Preprocedure Evaluation (Signed)
Anesthesia Evaluation  Patient identified by MRN, date of birth, ID band Patient awake    Reviewed: Allergy & Precautions, NPO status , Patient's Chart, lab work & pertinent test results  Airway Mallampati: I  TM Distance: >3 FB Neck ROM: Full    Dental   Pulmonary former smoker,    Pulmonary exam normal        Cardiovascular hypertension, Pt. on medications Normal cardiovascular exam     Neuro/Psych    GI/Hepatic   Endo/Other    Renal/GU      Musculoskeletal   Abdominal   Peds  Hematology   Anesthesia Other Findings   Reproductive/Obstetrics                            Anesthesia Physical Anesthesia Plan  ASA: II  Anesthesia Plan: General   Post-op Pain Management:    Induction: Intravenous  Airway Management Planned: LMA  Additional Equipment:   Intra-op Plan:   Post-operative Plan: Extubation in OR  Informed Consent: I have reviewed the patients History and Physical, chart, labs and discussed the procedure including the risks, benefits and alternatives for the proposed anesthesia with the patient or authorized representative who has indicated his/her understanding and acceptance.     Plan Discussed with: CRNA and Surgeon  Anesthesia Plan Comments:        Anesthesia Quick Evaluation  

## 2014-10-27 NOTE — Progress Notes (Signed)
Attempted to void 3 times.  Stated he felt an urge but no success.  Patient ambulated in hallway.

## 2014-10-27 NOTE — Anesthesia Procedure Notes (Signed)
Procedure Name: Intubation Date/Time: 10/27/2014 7:43 AM Performed by: Mechele Claude Pre-anesthesia Checklist: Patient identified, Emergency Drugs available, Suction available and Patient being monitored Patient Re-evaluated:Patient Re-evaluated prior to inductionOxygen Delivery Method: Circle System Utilized Preoxygenation: Pre-oxygenation with 100% oxygen Intubation Type: IV induction Ventilation: Mask ventilation without difficulty Laryngoscope Size: Mac and 3 Tube type: Oral Tube size: 8.0 mm Number of attempts: 1 Airway Equipment and Method: Stylet and LTA kit utilized Placement Confirmation: ETT inserted through vocal cords under direct vision,  positive ETCO2 and breath sounds checked- equal and bilateral Secured at: 22 cm Tube secured with: Tape Dental Injury: Teeth and Oropharynx as per pre-operative assessment

## 2014-10-27 NOTE — Progress Notes (Addendum)
CC: Dr. Franchot Gallo, Dr. Aris Georgia  End of treatment summary:  Diagnosis: Stage TIc favorable risk adenocarcinoma prostate  Intent: Curative  Implant date: 10/27/2014  Site/dose: Prostate 14,500 cGy implanting 67  I-125 seeds utilizing 31 needles.  Individual seed activity 0.363 mCi per seed for a total implant activity of 24.32 mCi.  Narrative: The patient appears to have undergone a successful Nucletron seed Selectron implant with Dr. Diona Fanti.  Plan: Follow-up with Dr. Diona Fanti and myself in approximately 3 weeks.  We will perform a CT scan for his post implant dosimetry at that time.

## 2014-10-27 NOTE — Progress Notes (Signed)
Dr Vernie Shanks ordered a foley cath to be inserted and removed by the patient in the am.   The patient tried one more time to void and was able to void 250cc.  His surgeon d/cd the order to insert catheter.Marland Kitchen

## 2014-10-27 NOTE — H&P (Signed)
Urology History and Physical Exam  CC: Prostate cancer  HPI: 64 year old male presents for I-125 brachytherapy  He underwent ultrasound and biopsy of his prostate on 07/20/2014. At that time PSA was 4.54, prostatic volume of 25.8 cc. PSA density 0.18. 7/12 cores were positive for adenocarcinoma, all Gleason 3+3 equal 6, 5 cores on the left, 2 cores on the right.  Following consultation regarding management--surgical extirpation vs. XRT, he decided on brachytherapy.    PMH: Past Medical History  Diagnosis Date  . History of gastric ulcer     yrs ago  . History of chronic sinusitis   . History of adenomatous polyp of colon     2006,  2010,  tubular adenoma 04-27-2014  . Diverticulosis of colon     moderate  . Borderline hyperlipidemia   . Arthritis   . Prostate cancer dx 06/2014    urologist-  Dr. Diona Fanti--  Stage T1c,  Gleason (3+3) 6,  vol. 25.46ml  . Hypertension   . History of gastroesophageal reflux (GERD)   . Carpal tunnel syndrome on both sides     right > left  . Wears glasses   . At risk for sleep apnea     STOP-BANG= 4        SENT TO PCP 10-25-2014    PSH: Past Surgical History  Procedure Laterality Date  . Cervical laminectomy  1984  . Tonsillectomy  child  . Prostate biopsy  07/20/14  . Colonoscopy  last one 04-27-2014    Allergies: Allergies  Allergen Reactions  . Augmentin [Amoxicillin-Pot Clavulanate] Swelling  . Ibuprofen Itching  . Penicillins     Pruritis, joint pains,throat swelling when he took Augmentin  . Sulfa Antibiotics Rash    Medications: Prescriptions prior to admission  Medication Sig Dispense Refill Last Dose  . aspirin 81 MG tablet Take 81 mg by mouth daily.   Past Month at Unknown time  . lisinopril (PRINIVIL,ZESTRIL) 10 MG tablet TAKE ONE TABLET BY MOUTH ONCE DAILY. (Patient taking differently: TAKE ONE TABLET BY MOUTH ONCE DAILY.--  takes in am) 90 tablet 0 10/26/2014 at Unknown time  . Multiple Vitamins-Minerals (MULTIVITAMIN  WITH MINERALS) tablet Take 1 tablet by mouth daily.   Past Month at Unknown time  . vitamin C (ASCORBIC ACID) 500 MG tablet Take 500 mg by mouth daily.   Past Month at Unknown time     Social History: History   Social History  . Marital Status: Married    Spouse Name: N/A  . Number of Children: 1  . 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 Smoking status: Former Smoker -- 0.50 packs/day for 45 years    Types: Cigarettes    Quit date: 03/29/2013  . Smokeless tobacco: Never Used  . Alcohol Use: 8.4 oz/week    14 Cans of beer, 0 Standard drinks or equivalent per week     Comment: 1-2 drinks per day   . Drug Use: No  . Sexual Activity: Not on file   Other Topics Concern  . Not on file   Social History Narrative   Married. No pets.  Daughter lives in [place]    Family History: Family History  Problem Relation Age of Onset  . Heart attack Mother     deceased age 66  . Stroke Father     deceased age 72  . Hypertension Father   .  Heart attack Brother   . Diabetes Neg Hx   . Prostate cancer Father     prostate in his late 19's    Review of Systems: Genitourinary, constitutional, skin, eye, otolaryngeal, hematologic/lymphatic, cardiovascular, pulmonary, endocrine, musculoskeletal, gastrointestinal, neurological and psychiatric system(s) were reviewed and pertinent findings if present are noted and are otherwise negative.  Gastrointestinal: nausea and diarrhea.  ENT: sore throat and sinus problems.                    Physical Exam: @VITALS2 @ Constitutional: Well nourished and well developed . No acute distress.  ENT:. The ears and nose are normal in appearance.  Neck: The appearance of the neck is normal.  Pulmonary: No respiratory distress and normal respiratory rhythm and effort.  Abdomen: The abdomen is flat. The abdomen is soft and nontender. No masses are palpated. No CVA tenderness. No  hernias are palpable. No hepatosplenomegaly noted.  Rectal: Rectal exam demonstrates normal sphincter tone, the anus is normal on inspection., no tenderness and no masses. Prostate size is estimated to be 30 g. Normal rectal tone, no rectal masses, prostate is smooth, symmetric and non-tender. The prostate has no nodularity and is not tender. The left seminal vesicle is nonpalpable. The right seminal vesicle is nonpalpable. The perineum is normal on inspection.  Genitourinary: Examination of the penis demonstrates no discharge, no masses, no lesions and a normal meatus. The penis is circumcised. The scrotum is normal in appearance and without lesions. The right epididymis is palpably normal and non-tender. The left epididymis is palpably normal and non-tender. The right spermatic cord is palpably normal. The left spermatic cord is palpably normal. The right testis is palpably normal, non-tender and without masses. The left testis is normal, non-tender and without masses.  Lymphatics: The femoral and inguinal nodes are not enlarged or tender.  Skin: Normal skin turgor, no visible rash and no visible skin lesions.  Neuro/Psych:. Mood and affect are appropriate.    Studies:  No results for input(s): HGB, WBC, PLT in the last 72 hours.  No results for input(s): NA, K, CL, CO2, BUN, CREATININE, CALCIUM, GFRNONAA, GFRAA in the last 72 hours.  Invalid input(s): MAGNESIUM   No results for input(s): INR, APTT in the last 72 hours.  Invalid input(s): PT   Invalid input(s): ABG    Assessment:  Adenocarcinoma of the prostate, stage T1c, Gleason 3+3  Plan: I-125 brachytherapy

## 2014-10-27 NOTE — Op Note (Signed)
Preoperative diagnosis: Clinical stage TI C adenocarcinoma the prostate   Postoperative diagnosis: Same   Procedure: I-125 prostate seed implantation with Nucletron robotic implanter   Surgeon: Lillette Boxer. Malayasia Mirkin M.D.  Radiation Oncologist:Murray  Anesthesia: Gen.   Indications: Patient  was diagnosed with clinical stage TI C prostate cancer. We had extensive discussion with him about treatment options versus. He elected to proceed with seed implantation. He underwent consultation my office as well as with Dr. Valere Dross. He appeared to understand the advantages disadvantages potential risks of this treatment option. Full informed consent has been obtained.   Technique and findings: Patient was brought the operating room where he had successful induction of general anesthesia. He was placed in dorso-lithotomy position and prepped and draped in usual manner. Appropriate surgical timeout was performed. Radiation oncology department placed a transrectal ultrasound probe anchoring stand. Foley catheter with contrast in the balloon was inserted without difficulty. Anchoring needles were placed within the prostate. Rectal tube was placed. Real-time contouring of the urethra prostate and rectum were performed and the dosing parameters were established. Targeted dose was 145 gray.  I was then called  to the operating suite suite for placement of the needles. A second timeout was performed. All needle passage was done with real-time transrectal ultrasound guidance with the sagittal plane. A total of 31 needles were placed. The implantation itself was done with the robotic implanter. 38 active seeds were implanted. The Foley catheter was removed and flexible cystoscopy failed to show any seeds outside the prostate.  The patient was brought to recovery room in stable condition, having tolerated the procedure well.Marland Kitchen

## 2014-10-27 NOTE — Discharge Instructions (Signed)
Radioactive Seed Implant Home Care Instructions ° ° °Activity:    Rest for the remainder of the day.  Do not drive or operate equipment today.  You may resume normal  activities in a few days as instructed by your physician, without risk of harmful radiation exposure to those around you, provided you follow the time and distance precautions on the Radiation Oncology Instruction Sheet. ° ° °Meals: Drink plenty of lipuids and eat light foods, such as gelatin or soup this evening .  You may return to normal meal plan tomorrow. ° °Return °To Work: You may return to work as instructed by your physician. ° °Special °Instruction:   If any seeds are found, use tweezers to pick up seeds and place in a glass container of any kind and bring to your physician's office. ° °Call your physician if any of these symptoms occur: ° °· Persistent or heavy bleeding °· Urine stream diminishes or stops completely after catheter is removed °· Fever equal to or greater than 101 degrees F °· Cloudy urine with a strong foul odor °· Severe pain ° °You may feel some burning pain and/or hesitancy when you urinate after the catheter is removed.  These symptoms may increase over the next few weeks, but should diminish within forur to six weeks.  Applying moist heat to the lower abdomen or a hot tub bath may help relieve the pain.  If the discomfort becomes severe, please call your physician for additional medications. ° ° °Post Anesthesia Home Care Instructions ° °Activity: °Get plenty of rest for the remainder of the day. A responsible adult should stay with you for 24 hours following the procedure.  °For the next 24 hours, DO NOT: °-Drive a car °-Operate machinery °-Drink alcoholic beverages °-Take any medication unless instructed by your physician °-Make any legal decisions or sign important papers. ° °Meals: °Start with liquid foods such as gelatin or soup. Progress to regular foods as tolerated. Avoid greasy, spicy, heavy foods. If nausea  and/or vomiting occur, drink only clear liquids until the nausea and/or vomiting subsides. Call your physician if vomiting continues. ° °Special Instructions/Symptoms: °Your throat may feel dry or sore from the anesthesia or the breathing tube placed in your throat during surgery. If this causes discomfort, gargle with warm salt water. The discomfort should disappear within 24 hours. ° °If you had a scopolamine patch placed behind your ear for the management of post- operative nausea and/or vomiting: ° °1. The medication in the patch is effective for 72 hours, after which it should be removed.  Wrap patch in a tissue and discard in the trash. Wash hands thoroughly with soap and water. °2. You may remove the patch earlier than 72 hours if you experience unpleasant side effects which may include dry mouth, dizziness or visual disturbances. °3. Avoid touching the patch. Wash your hands with soap and water after contact with the patch. °  ° ° ° °

## 2014-10-28 ENCOUNTER — Encounter (HOSPITAL_BASED_OUTPATIENT_CLINIC_OR_DEPARTMENT_OTHER): Payer: Self-pay | Admitting: Urology

## 2014-10-28 NOTE — Anesthesia Postprocedure Evaluation (Signed)
Anesthesia Post Note  Patient: Gary Blair  Procedure(s) Performed: Procedure(s) (LRB): RADIOACTIVE SEED IMPLANT/BRACHYTHERAPY IMPLANT (N/A)  Anesthesia type: general  Patient location: PACU  Post pain: Pain level controlled  Post assessment: Patient's Cardiovascular Status Stable  Last Vitals:  Filed Vitals:   10/27/14 1549  BP: 157/76  Pulse: 113  Temp: 36.4 C  Resp: 14    Post vital signs: Reviewed and stable  Level of consciousness: sedated  Complications: No apparent anesthesia complications

## 2014-11-21 ENCOUNTER — Telehealth: Payer: Self-pay | Admitting: *Deleted

## 2014-11-21 ENCOUNTER — Encounter: Payer: Self-pay | Admitting: *Deleted

## 2014-11-21 NOTE — Telephone Encounter (Signed)
XXXX 

## 2014-11-21 NOTE — Telephone Encounter (Signed)
CALLED PATIENT TO REMIND OF APPTS. FOR 11/22/14, LVM FOR A RETURN CALL

## 2014-11-22 ENCOUNTER — Ambulatory Visit
Admission: RE | Admit: 2014-11-22 | Discharge: 2014-11-22 | Disposition: A | Discharge status: Home / Self Care | Payer: Medicare HMO | Source: Ambulatory Visit | Attending: Radiation Oncology | Admitting: Radiation Oncology

## 2014-11-22 ENCOUNTER — Ambulatory Visit
Admit: 2014-11-22 | Discharge: 2014-11-22 | Disposition: A | Discharge status: Home / Self Care | Payer: Medicare HMO | Attending: Radiation Oncology | Admitting: Radiation Oncology

## 2014-11-22 VITALS — BP 135/80 | HR 94 | Temp 97.8°F | Ht 64.0 in | Wt 123.3 lb

## 2014-11-22 DIAGNOSIS — C61 Malignant neoplasm of prostate: Secondary | ICD-10-CM

## 2014-11-22 NOTE — Progress Notes (Signed)
CC: Dr. Franchot Gallo  Follow-up note:  Gary Blair returns today approximately 3 weeks following prostate seed implantation in the management of his stage TIc favorable risk adenocarcinoma prostate.  He does have some urinary hesitancy and slight dysuria on initiation of his stream.  No GI difficulties.  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  His CT scan from this morning shows what appears to be a good seed distribution.  Physical examination:  Filed Vitals:   11/22/14 1112  BP: 135/80  Pulse: 94  Temp: 97.8 F (36.6 C)   Rectal examination not performed today.  Impression: Satisfactory progress.  He does have some mild to moderate obstructive symptomatology which will hopefully improve the near future.  He will see Dr. Diona Fanti next week and if his symptoms persist he may place him on an alpha blocker.  Plan: I've not scheduled Gary Blair for a formal follow-up visit and he understands that Dr. Diona Fanti will provide follow-up including PSA determinations.  I ask that Dr. Diona Fanti keep me posted on his progress.

## 2014-11-22 NOTE — Progress Notes (Signed)
Complex simulation note: The patient was taken to the CT simulator.  His pelvis was scanned.  The CT data set was sent to the planning system for contouring of his prostate and rectum.  He will now undergo brachytherapy isodose dosimetry to assess the quality of his seed implant.

## 2014-11-22 NOTE — Progress Notes (Signed)
[name] reports stinging when urinary stream starts and intermittent periods of incomplete emptying and straining.  Denies nocturia and denies any proctitis or change in bowel habits.

## 2014-11-29 ENCOUNTER — Other Ambulatory Visit: Payer: Self-pay | Admitting: Family Medicine

## 2014-11-30 ENCOUNTER — Encounter: Payer: Self-pay | Admitting: Radiation Oncology

## 2014-11-30 NOTE — Progress Notes (Signed)
CC: Dr. Franchot Gallo  Post-implant CT dosimetry note:  Gary Blair completed his post implant CT dosimetry to assess the quality of his prostate seed implant.  His intraoperative prostate volume by ultrasound was 27.2 mL while his postoperative prostate volume by CT was 25.4 mL, a good correlation.  Dose volume histograms were obtained for the prostate and rectum.  His prostate D 90 is 125.8% (goal 90%) and V100  98.4% (goal 80%), both excellent.  Only 0.46 mL of rectum received the prescribed dose of 14,500 cGy.  In summary, Gary Blair has excellent post implant dosimetry with a low risk for late rectal toxicity.

## 2015-01-04 ENCOUNTER — Encounter: Payer: Self-pay | Admitting: *Deleted

## 2015-02-27 ENCOUNTER — Other Ambulatory Visit: Payer: Self-pay | Admitting: Medical

## 2015-03-08 DIAGNOSIS — C61 Malignant neoplasm of prostate: Secondary | ICD-10-CM | POA: Diagnosis not present

## 2015-03-08 DIAGNOSIS — R3915 Urgency of urination: Secondary | ICD-10-CM | POA: Diagnosis not present

## 2015-03-08 DIAGNOSIS — R350 Frequency of micturition: Secondary | ICD-10-CM | POA: Diagnosis not present

## 2015-04-03 ENCOUNTER — Encounter: Payer: Self-pay | Admitting: Medical

## 2015-04-03 ENCOUNTER — Ambulatory Visit (INDEPENDENT_AMBULATORY_CARE_PROVIDER_SITE_OTHER): Payer: Self-pay | Admitting: Medical

## 2015-04-03 VITALS — BP 120/70 | HR 97 | Ht 63.5 in | Wt 124.0 lb

## 2015-04-03 DIAGNOSIS — C61 Malignant neoplasm of prostate: Secondary | ICD-10-CM

## 2015-04-03 DIAGNOSIS — Z0289 Encounter for other administrative examinations: Secondary | ICD-10-CM

## 2015-04-03 DIAGNOSIS — J0110 Acute frontal sinusitis, unspecified: Secondary | ICD-10-CM

## 2015-04-03 DIAGNOSIS — I10 Essential (primary) hypertension: Secondary | ICD-10-CM

## 2015-04-03 DIAGNOSIS — E7800 Pure hypercholesterolemia, unspecified: Secondary | ICD-10-CM

## 2015-04-03 DIAGNOSIS — R7301 Impaired fasting glucose: Secondary | ICD-10-CM

## 2015-04-03 LAB — POCT URINALYSIS DIPSTICK
Bilirubin, UA: NEGATIVE
Glucose, UA: NEGATIVE
KETONES UA: NEGATIVE
Leukocytes, UA: NEGATIVE
Nitrite, UA: NEGATIVE
PROTEIN UA: NEGATIVE
RBC UA: NEGATIVE
SPEC GRAV UA: 1.025
UROBILINOGEN UA: NEGATIVE
pH, UA: 6.5

## 2015-04-03 MED ORDER — CLARITHROMYCIN 500 MG PO TABS: 500.0000 mg | ORAL_TABLET | Freq: Two times a day (BID) | ORAL | Status: DC

## 2015-04-03 NOTE — Progress Notes (Signed)
Commercial Driver Medical Examination   Gary Blair is a 64 y.o. male who presents today for a commercial driver fitness determination physical exam.  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   Last DOT physical 1 year ago here with me.  Medical care team includes:  Dr. Rita Ohara here - primary care  Dr. Diona Fanti, Urology  Dr. Arloa Koh, radiation oncology  The patient reports 1+ week hx/o sinus pressure, yellow nasal discharge, decreased sense of smell, but no fever, cough, chills, body aches, NVD.  Review of Systems A comprehensive review of systems was reviewed and noted as below:  Eye: - corrective lenses, -lasik surgery or other eye surgery, -glaucoma, -cataracts, -macular degeneration, -monocular vision, -medication for eye condition, -blurred vision,   Ears: -hearing problems, - hearing aids, -ear pain, -ear drainage, -ear fullness, -tinnitus, -recurrent ear infection, -previous ear surgery, - vertigo, -meniere's disease  Endocrine: -polydipsia, -polyuria, -weight loss, -fainting, -dizziness, - altered or loss of consciousness, -hypoglycemia  Cardiovascular: -heart disease, -CHF, -heart attack, -cardiac stents, -bypass surgery, -other heart surgery, +hypertension, -blood clots, -pacemaker, -medications for heart condition, -chest pain, -SOB, -palpitations, -fainting, -dizziness, -dyspnea  Respiratory: -asthma, -COPD, other lung disease, -smoker, -chest tightness, - wheezing, -snoring, -daytime sleepiness, -sleep apnea or uses CPAP, -narcolepsy, -sleeping disorder  Allergy: -uncontrollable sneezing or allergy symptoms  Musculoskeletal: -missing body parts, -muscle disease, -bone disease, -spine injury, -low back pain, -medication for joints, bones, muscles or pain, -physical limitations, -joint pain, -neck pain, -limitations of neck ROM, -back surgery, orthopedic surgery, -rheumatologic condition, -gout  Neurologic: +chronic mild paresthesias QHS, no  change in 20 years per his report,  otherwise -dementia, -seizures, -parkinson's, -tremor, -memory problems, -weakness, -numbness, -tingling, -medication for neurologic condition, -medications for sleep condition  Gastric: -abdominal pain, -chronic diarrhea or IBS, -uncontrollable nausea  Kidney/Renal: -hematuria, -dialysis, kidney disease, polycystic kidney disease  Psychiatric: -homicidal thoughts, -suicidal thoughts, -prior suicide attempts, -get into fights/hurting others, -memory or concentration problems, -delusions, -hallucinations, -hospitalization for mental health problem, -depression, -anxiety, -bipolar  Drug use: - none  Reviewed their medical, surgical, family, social, medication, and allergy history and updated chart as appropriate.      Objective:   Physical Exam  BP 120/70 mmHg  Pulse 97  Ht 5' 3.5" (1.613 m)  Wt 124 lb (56.246 kg)  BMI 21.62 kg/m2  General appearance: alert, no distress, WD/WN, lean white male Skin:scattered macules, no particular worrisome lesions HEENT: normocephalic, conjunctiva/corneas normal, sclerae anicteric, PERRLA, EOMi, mild frontal sinus tenderness, nares patent, no discharge or erythema, pharynx normal Oral cavity: MMM, tongue normal, teeth in good repair Neck: supple, no lymphadenopathy, no thyromegaly, no masses, normal ROM, no bruits Chest: non tender, normal shape and expansion Heart: RRR, normal S1, S2, no murmurs Lungs: CTA bilaterally, no wheezes, rhonchi, or rales Abdomen: +bs, soft, non tender, non distended, no masses, no hepatomegaly, no splenomegaly, no bruits Back: lumbar surgical scar, non tender, normal ROM, no scoliosis Musculoskeletal: left base of thumb with arthritis changes, arthriitc changes of fingers, but no limitations on ROM, otherwise upper extremities non tender, no obvious deformity, normal ROM throughout, lower extremities non tender, no obvious deformity, normal ROM throughout Extremities: no edema, no  cyanosis, no clubbing Pulses: 2+ symmetric, upper and lower extremities, normal cap refill Neurological: -tinels and phalens, but bilat thenar eminences seem decreased muscle mass, otherwise alert, oriented x 3, CN2-12 intact, strength normal upper extremities and lower extremities, sensation normal throughout, DTRs 2+ throughout, no cerebellar signs, gait normal  Psychiatric: normal affect, behavior normal, pleasant  GU: normal male external genitalia, circumcised, nontender, no masses, no hernia, no lymphadenopathy Rectal: deferred  Hearing, vision screens and UA reviewed   Assessment:   Encounter Diagnoses  Name Primary?  . Health examination of defined subpopulation Yes  . Essential hypertension, benign   . Pure hypercholesterolemia   . Prostate cancer ([place])   . Impaired fasting glucose       Plan:   DOT certificate given, copies made, qualifies for 2 year certificate, wears corrective lenses.    Forms completed and results uploaded to [facility] registry.  C/t routine f/u with Dr. Tomi Bamberger  Also addressed his sinusitis, discussed supportive care, and begin Biaxin for sinusitis.

## 2015-04-17 ENCOUNTER — Encounter: Payer: Self-pay | Admitting: Family Medicine

## 2015-04-17 ENCOUNTER — Ambulatory Visit (INDEPENDENT_AMBULATORY_CARE_PROVIDER_SITE_OTHER): Payer: Medicare HMO | Admitting: Family Medicine

## 2015-04-17 VITALS — BP 140/64 | HR 92 | Ht 63.0 in | Wt 129.8 lb

## 2015-04-17 DIAGNOSIS — J309 Allergic rhinitis, unspecified: Secondary | ICD-10-CM

## 2015-04-17 DIAGNOSIS — Z1159 Encounter for screening for other viral diseases: Secondary | ICD-10-CM | POA: Diagnosis not present

## 2015-04-17 DIAGNOSIS — Z0000 Encounter for general adult medical examination without abnormal findings: Secondary | ICD-10-CM

## 2015-04-17 DIAGNOSIS — R7301 Impaired fasting glucose: Secondary | ICD-10-CM

## 2015-04-17 DIAGNOSIS — R000 Tachycardia, unspecified: Secondary | ICD-10-CM

## 2015-04-17 DIAGNOSIS — Z23 Encounter for immunization: Secondary | ICD-10-CM

## 2015-04-17 DIAGNOSIS — E7800 Pure hypercholesterolemia, unspecified: Secondary | ICD-10-CM | POA: Diagnosis not present

## 2015-04-17 DIAGNOSIS — I10 Essential (primary) hypertension: Secondary | ICD-10-CM | POA: Diagnosis not present

## 2015-04-17 DIAGNOSIS — C61 Malignant neoplasm of prostate: Secondary | ICD-10-CM | POA: Diagnosis not present

## 2015-04-17 DIAGNOSIS — R5383 Other fatigue: Secondary | ICD-10-CM

## 2015-04-17 LAB — COMPREHENSIVE METABOLIC PANEL
ALT: 16 U/L (ref 9–46)
AST: 19 U/L (ref 10–35)
Albumin: 3.9 g/dL (ref 3.6–5.1)
Alkaline Phosphatase: 78 U/L (ref 40–115)
BILIRUBIN TOTAL: 0.6 mg/dL (ref 0.2–1.2)
BUN: 14 mg/dL (ref 7–25)
CO2: 21 mmol/L (ref 20–31)
CREATININE: 0.96 mg/dL (ref 0.70–1.25)
Calcium: 8.9 mg/dL (ref 8.6–10.3)
Chloride: 108 mmol/L (ref 98–110)
GLUCOSE: 109 mg/dL — AB (ref 65–99)
Potassium: 3.9 mmol/L (ref 3.5–5.3)
SODIUM: 139 mmol/L (ref 135–146)
Total Protein: 6.5 g/dL (ref 6.1–8.1)

## 2015-04-17 LAB — HEMOGLOBIN A1C
Hgb A1c MFr Bld: 6.2 % — ABNORMAL HIGH (ref <5.7)
MEAN PLASMA GLUCOSE: 131 mg/dL — AB (ref <117)

## 2015-04-17 LAB — TSH: TSH: 1.975 u[IU]/mL (ref 0.350–4.500)

## 2015-04-17 MED ORDER — FLUTICASONE PROPIONATE 50 MCG/ACT NA SUSP: 2.0000 | Freq: Every day | NASAL | Status: DC

## 2015-04-17 NOTE — Progress Notes (Signed)
Chief Complaint  Patient presents with  . Medicare Wellness    fasting med check plus. No concerns.   Gary Blair is a 64 y.o. male who presents for annual wellness visit and follow-up on chronic medical conditions.  He has the following concerns:  He complains of sore throat and congestion this time of year all the time.  He recently had DOT physical with Audelia Acton. He was put on Biaxin at that time for illness.  The sore throat improved, but he has persistent congestion.  Last year he reports he tried benadryl and claritin without improvement.  Hasn't ever tried nasal steroids, just saline. Symptoms always start late October/early November, and usually lasts until February.  Mucus is clear to yellow, no cough during the day, just the morning.  He has postnasal drainage and throat-clearing.  Patient presents for follow up on hypertension. He was started on lisinopril in December 2015. He checks his blood pressure weekly at pharmacy, usually running 124-128/70-85 on average.  His BP was 120/70 on recent DOT physical visit. Denies any side effects or cough (just recent from PND). Denies headaches, dizziness, chest pain, palpitations, edema.  STOP Bang screen was 4, at risk for sleep apnea.  Wife reports that he snores, but nothing about apnea.  Denies any daytime somnolence, and feels refreshed during the day.  Denies morning headaches.  Prostate Cancer:  He is s/p brachytherapy 09/2014.  He last saw urologist in November. He was given Rapaflo samples which he tried for a month.  He has some burning and hesitancy/weak stream.  It had improved with the Rapaflo, but symptoms recurred at the tail end of taking the samples.  He was then prescribed uroxatral, which wasn't started until yesterday due to him taking Biaxin. PSA's are being monitored by urologist, due again for follow-up in February.  Impaired fasting glucose: he doesn't check sugars. He tries to limit sugar in his diet (candy, sweets, no  regular sodas or sweet tea--very rare). He continues to eat ice cream every night, and alcohol as per social history.  Immunization History  Administered Date(s) Administered  . Influenza Split 03/16/2005, 02/15/2006, 02/01/2007, 02/25/2008, 04/20/2009, 01/28/2012  . Influenza Whole 02/09/2013  . Influenza,trivalent, recombinat, inj, PF 01/08/2014  . Influenza-Unspecified 01/01/2015  . Pneumococcal Conjugate-13 04/11/2014  . Pneumococcal Polysaccharide-23 12/14/2004  . Td 06/27/1989, 09/30/2001  . Tdap 01/02/2006  . Zoster 04/26/2011   Last colonoscopy: 03/2014--Dr. Erlene Quan, showed adenomatous colon polyps.   Last PSA: per urologist, UTD Dentist: twice yearly  Ophtho: yearly, went in 01/2015 Exercise: unloading trucks, active on job, Haematologist. He has [employer], uses it 5d/wk (15 mins straight, keeps heart rate up)   Other doctors caring for patient include: Ophtho: Dr. Satira Sark Derm: Dr. Ubaldo Glassing Dentist: Dr. Osa Craver GI: Dr. Erlene Quan ([facility], in [place]) Urology: Dr. Diona Fanti Radiation onc: Dr. Valere Dross  Depression screen: negative Fall screen: negative Functional status screen:  Notable only for some urinary leakage (see ROS).  See full screen in Epic.  End of Life Discussion:  Patient has a living will and medical power of attorney  Past Medical History  Diagnosis Date  . History of gastric ulcer     yrs ago  . History of chronic sinusitis   . History of adenomatous polyp of colon     2006,  2010,  tubular adenoma 04-27-2014 due again 03/2017  . Diverticulosis of colon     moderate  . Borderline hyperlipidemia   . Arthritis   . Prostate cancer (  [facility]) dx 06/2014    urologist-  Dr. Diona Fanti--  Stage T1c,  Gleason (3+3) 6,  vol. 25.5m  . Hypertension   . History of gastroesophageal reflux (GERD)   . Carpal tunnel syndrome on both sides     right > left  . Wears glasses   . At risk for sleep apnea     STOP-BANG= 4        SENT TO PCP 10-25-2014  . S/P radiation  therapy 10/27/14    Prostate 14,500 cGy implanting 67 I-125 seeds utilizing 31 needles. Individual seed activity 0.363 mCi per seed for a total implant activity of 24.32 mCi.    Past Surgical History  Procedure Laterality Date  . Cervical laminectomy  1984  . Tonsillectomy  child  . Prostate biopsy  07/20/14  . Colonoscopy  last one 04-27-2014  . Radioactive seed implant N/A 10/27/2014    Procedure: RADIOACTIVE SEED IMPLANT/BRACHYTHERAPY IMPLANT;  Surgeon: SFranchot Gallo MD;  [facility]  Service: Urology;  Laterality: N/A;  617seeds implanted no seeds found in bladder     Social History   Social History  . Marital Status: Married    Spouse Name: N/A  . Number of Children: 1  . Years of Education: N/A   Occupational History  . Truck dMarket researcher  Social History Main Topics  . Smoking status: Former Smoker -- 0.50 packs/day for 45 years    Types: Cigarettes    Quit date: 03/29/2013  . Smokeless tobacco: Never Used  . Alcohol Use: 8.4 oz/week    14 Cans of beer, 0 Standard drinks or equivalent per week     Comment: average of 1-2 drinks per day, 4-5 days/week, up to 4-5 drinks at a time on the weekend socially, watching ballgame.  . Drug Use: No  . Sexual Activity: Not on file   Other Topics Concern  . Not on file   Social History Narrative   Married. No pets.  Daughter lives in [place]   Family History  Problem Relation Age of Onset  . Heart attack Mother     deceased age 66 . Stroke Father     deceased age 66 . Hypertension Father   . Heart attack Brother   . Diabetes Neg Hx   . Prostate cancer Father     prostate in his late 73's   Outpatient Encounter Prescriptions as of 04/17/2015  Medication Sig Note  . alfuzosin (UROXATRAL) 10 MG 24 hr tablet Take 10 mg by mouth daily with breakfast.  04/17/2015: He just started this yesterday, had to wait to finish clarithromycin; prescribed by urologist  . aspirin 81 MG  tablet Take 81 mg by mouth daily.   .Marland Kitchenlisinopril (PRINIVIL,ZESTRIL) 10 MG tablet TAKE ONE TABLET BY MOUTH ONCE DAILY.   . Multiple Vitamins-Minerals (MULTIVITAMIN WITH MINERALS) tablet Take 1 tablet by mouth daily.   . vitamin C (ASCORBIC ACID) 500 MG tablet Take 500 mg by mouth daily.   . fluticasone (FLONASE) 50 MCG/ACT nasal spray Place 2 sprays into both nostrils daily.   . [DISCONTINUED] clarithromycin (BIAXIN) 500 MG tablet Take 1 tablet (500 mg total) by mouth 2 (two) times daily.   . [DISCONTINUED] silodosin (RAPAFLO) 4 MG CAPS capsule Take 4 mg by mouth daily with supper.    No facility-administered encounter medications on file as of 04/17/2015.    Allergies  Allergen Reactions  . Augmentin [Amoxicillin-Pot Clavulanate] Swelling  .  Ciprofloxacin Hcl Other (See Comments)    Stated reddened skin  . Ibuprofen Itching  . Penicillins     Pruritis, joint pains,throat swelling when he took Augmentin  . Sulfa Antibiotics Rash   ROS: The patient denies fever, chills, weight changes. Denies headaches, vision loss, decreased hearing, ear pain, chest pain, palpitations, dizziness, syncope, dyspnea on exertion, cough, swelling, constipation, abdominal pain, melena, hematochezia, hematuria, genital lesions, weakness, tremor, suspicious skin lesions, depression, anxiety, abnormal bleeding/bruising, or enlarged lymph nodes. Wears braces at night bilaterally for CTS--has tingling/numbness occasionally even when wearing braces. He denies any pain. Chronic PND and throat problems as per HPI. Some dribbling at the end of voiding, no other leakage or incontinence. Some hesitancy and weakened stream. +ED   PHYSICAL EXAM:  BP 150/80 mmHg  Pulse 92  Ht '5\' 3"'  (1.6 m)  Wt 129 lb 12.8 oz (58.877 kg)  BMI 23.00 kg/m2 140/64 on repeat by MD Rate 120 by MD  General Appearance:  Alert, cooperative, no distress, appears stated age   Head:  Normocephalic, without obvious abnormality, atraumatic    Eyes:  PERRL, conjunctiva/corneas clear, EOM's intact, fundi  benign   Ears:  Normal TM's and external ear canals  Nose:  Nares normal, mucosa is moderately edematous, slightly red, no drainage or sinus tenderness   Throat:  Lips, mucosa, and tongue normal; teeth and gums normal. OP is without erythema, exudates, lesions  Neck:  Supple, no lymphadenopathy; thyroid: no enlargement/tenderness/nodules; no carotid  bruit or JVD.   Back:  Spine nontender, no curvature, ROM normal, no CVA tenderness   Lungs:  Clear to auscultation bilaterally without wheezes, rales or ronchi; respirations unlabored   Chest Wall:  No tenderness or deformity   Heart:  Tachycardic, rate of 120, regular rhythm, S1 and S2 normal, no murmur, rub or gallop   Breast Exam:  No chest wall tenderness, masses or gynecomastia   Abdomen:  Soft, non-tender, nondistended, normoactive bowel sounds,  no masses, no hepatosplenomegaly   Genitalia:  Deferred to urologist   Rectal:  Deferred to urologist  Extremities:  No clubbing, cyanosis or edema   Pulses:  2+ and symmetric all extremities   Skin:  Skin color, texture, turgor normal, no rashes or lesions. Skin is very dry, especially on the lower legs.   Lymph nodes:  Cervical, supraclavicular, and axillary nodes normal   Neurologic:  CNII-XII intact, normal strength, sensation and gait; reflexes 2+ and symmetric throughout    Psych:   Normal mood, affect, hygiene and grooming       EKG:  Sinus tachycardia, no ischemia.  ASSESSMENT/PLAN:  Medicare annual wellness visit, initial  Immunization due - Plan: Pneumococcal polysaccharide vaccine 23-valent greater than or equal to 2yo subcutaneous/IM  Impaired fasting glucose - proper diet reviewed--cut back on ice cream/sweets, alcohol; daily exercise - Plan: Hemoglobin A1c  Pure hypercholesterolemia  Essential hypertension,  benign - elevated today, along with pulse, likely anxiety; normal last week. continue current meds - Plan: Comprehensive metabolic panel, EKG 16-[id number]  Prostate cancer ([facility]) - s/p brachytherapy, doing well. Some urinary symptoms and ED, under care of urologist  Need for hepatitis C screening test - Plan: Hepatitis C antibody  Other fatigue - Plan: TSH  Tachycardia - sinus tach. Let us know if persists (suspect anxiety/white coat). - Plan: EKG 12-Lead  Allergic rhinitis, unspecified allergic rhinitis type - no evidence of ongoing infection. Daily nasal steroid and oral antihistamine recommended - Plan: fluticasone (FLONASE) 50 MCG/ACT nasal spray  Recommended at least 30 minutes of aerobic activity at least 5 days/week; proper sunscreen use reviewed; healthy diet and alcohol recommendations (less than or equal to 2 drinks/day) reviewed; regular seatbelt use; changing batteries in smoke detectors. Self-testicular exams. Immunization recommendations discussed--Pneumovax today. Due for Td in 2017.  To check with his insurance; given written rx for Td in case he needs to get at pharmacy. Colonoscopy recommendations reviewed--UTD, due again 2018.  Hep C Ab, TSH, c-met, A1c Had normal CBC in June Lipids last year were good: Lab Results  Component Value Date   CHOL 177 04/11/2014   HDL 69 04/11/2014   LDLCALC 98 04/11/2014   TRIG 48 04/11/2014   CHOLHDL 2.6 04/11/2014    Pneumovax given today.  Risks/side effects reviewed.  6 months f/u   Medicare Attestation I have personally reviewed: The patient's medical and social history Their use of alcohol, tobacco or illicit drugs Their current medications and supplements The patient's functional ability including ADLs,fall risks, home safety risks, cognitive, and hearing and visual impairment Diet and physical activities Evidence for depression or mood disorders  The patient's weight, height, and BMI have been recorded in the chart.  I  have made referrals, counseling, and provided education to the patient based on review of the above and I have provided the patient with a written personalized care plan for preventive services.     Valencia Kassa A, MD   04/17/2015

## 2015-04-17 NOTE — Patient Instructions (Addendum)
  HEALTH MAINTENANCE RECOMMENDATIONS:  It is recommended that you get at least 30 minutes of aerobic exercise at least 5 days/week (for weight loss, you may need as much as 60-90 minutes). This can be any activity that gets your heart rate up. This can be divided in 10-15 minute intervals if needed, but try and build up your endurance at least once a week.  Weight bearing exercise is also recommended twice weekly.  Eat a healthy diet with lots of vegetables, fruits and fiber.  "Colorful" foods have a lot of vitamins (ie green vegetables, tomatoes, red peppers, etc).  Limit sweet tea, regular sodas and alcoholic beverages, all of which has a lot of calories and sugar.  Up to 2 alcoholic drinks daily may be beneficial for men (unless trying to lose weight, watch sugars).  Drink a lot of water.  Sunscreen of at least SPF 30 should be used on all sun-exposed parts of the skin when outside between the hours of 10 am and 4 pm (not just when at beach or pool, but even with exercise, golf, tennis, and yard work!)  Use a sunscreen that says "broad spectrum" so it covers both UVA and UVB rays, and make sure to reapply every 1-2 hours.  Remember to change the batteries in your smoke detectors when changing your clock times in the spring and fall.  Use your seat belt every time you are in a car, and please drive safely and not be distracted with cell phones and texting while driving.     Mr. Stallard , Thank you for taking time to come for your Medicare Wellness Visit. I appreciate your ongoing commitment to your health goals. Please review the following plan we discussed and let me know if I can assist you in the future.   These are the goals we discussed: Goals    None      This is a list of the screening recommended for you and due dates:  Health Maintenance  Topic Date Due  .  Hepatitis C: One time screening is recommended by Center for Disease Control  (CDC) for  adults born from [birth year] through 1967.    1951/03/28  . Pneumonia vaccines (2 of 2 - PPSV23) 04/12/2015  . Flu Shot  11/28/2015  . Tetanus Vaccine  01/03/2016  . Colon Cancer Screening  04/27/2017  . Shingles Vaccine  Completed   Hepatitis C testing is being done today. You are getting the last pneumonia vaccine today (pneumovax).  At your convenience, please get [place] copies of the healthcare power of attorney and Living Will that you have.  You will be due for a tetanus booster in 2017. Check with your insurance to see if they cover it, and if it needs to be gotten from a pharmacy vs whether or not you can get it from our office.  I gave you a written prescription for Td in case it needs to be from pharmacy, like I suspect.

## 2015-04-18 DIAGNOSIS — R69 Illness, unspecified: Secondary | ICD-10-CM | POA: Diagnosis not present

## 2015-04-18 LAB — HEPATITIS C ANTIBODY: HCV Ab: NEGATIVE

## 2015-05-20 DIAGNOSIS — Z23 Encounter for immunization: Secondary | ICD-10-CM | POA: Diagnosis not present

## 2015-05-25 ENCOUNTER — Other Ambulatory Visit: Payer: Self-pay | Admitting: Medical

## 2015-06-07 DIAGNOSIS — R69 Illness, unspecified: Secondary | ICD-10-CM | POA: Diagnosis not present

## 2015-06-23 DIAGNOSIS — C61 Malignant neoplasm of prostate: Secondary | ICD-10-CM | POA: Diagnosis not present

## 2015-06-30 DIAGNOSIS — C61 Malignant neoplasm of prostate: Secondary | ICD-10-CM | POA: Diagnosis not present

## 2015-06-30 DIAGNOSIS — Z0000 Encounter for general adult medical examination without abnormal findings: Secondary | ICD-10-CM | POA: Diagnosis not present

## 2015-06-30 DIAGNOSIS — N320 Bladder-neck obstruction: Secondary | ICD-10-CM | POA: Diagnosis not present

## 2015-08-14 DIAGNOSIS — R69 Illness, unspecified: Secondary | ICD-10-CM | POA: Diagnosis not present

## 2015-10-05 ENCOUNTER — Encounter: Payer: Self-pay | Admitting: Family Medicine

## 2015-10-05 ENCOUNTER — Ambulatory Visit (INDEPENDENT_AMBULATORY_CARE_PROVIDER_SITE_OTHER): Payer: Medicare HMO | Admitting: Family Medicine

## 2015-10-05 VITALS — BP 124/68 | HR 100 | Temp 97.3°F | Ht 63.0 in | Wt 125.4 lb

## 2015-10-05 DIAGNOSIS — J069 Acute upper respiratory infection, unspecified: Secondary | ICD-10-CM | POA: Diagnosis not present

## 2015-10-05 DIAGNOSIS — C61 Malignant neoplasm of prostate: Secondary | ICD-10-CM | POA: Diagnosis not present

## 2015-10-05 DIAGNOSIS — I10 Essential (primary) hypertension: Secondary | ICD-10-CM

## 2015-10-05 DIAGNOSIS — R7301 Impaired fasting glucose: Secondary | ICD-10-CM | POA: Diagnosis not present

## 2015-10-05 LAB — POCT GLYCOSYLATED HEMOGLOBIN (HGB A1C): Hemoglobin A1C: 5.7

## 2015-10-05 NOTE — Patient Instructions (Addendum)
  Drink plenty of water. Use guaifenesin (found in robitussin and mucinex products). You can use the DM versions (which contain dextromethorphan, a cough suppressant, also found by itself in Delsym syrup) if your cough worsens; this is a cough suppressant. Avoid decongestants as these raise blood pressure. You can safely use Coricidin HBP (or continue benadryl if you prefer). Return if you develop high fever, worsening cough or shortness of breath, or persistent/worsening symptoms.  Continue your current medications, and keep up the healthy, low sugar diet.

## 2015-10-05 NOTE — Progress Notes (Signed)
Chief Complaint  Patient presents with  . Hypertension    nonfasting med check. Mentions that he also has a cold that started Tuesday, mucus is yellow to clear-not sure if he has had any fevers.    Patient is complaining of URI symptoms x 2 days. Complaining of sore throat at first, which has improved, but is now having postnasal drainage, runny nose, and slight cough.  Denies fever, chills, sinus pain, ear pain, shortness of breath. No recent sick close contacts.  Patient presents for follow up on hypertension. He was started on lisinopril in December 2015. He checks his blood pressure every 1-2 weeks at pharmacy.  It was 103/69 over the weekend, usually running 120's/low 70's.Denies any side effects or cough (just from current cold). Denies headaches, dizziness, chest pain, palpitations, edema. His pulse usually runs in the 80's.  He never sees it at (or over) 100.  Prostate Cancer: He is s/p brachytherapy 09/2014. He last saw urologist in early March, and was given a good report, first PSA of 0.54.  Due to see him again in September. He has been taking uroxatral, but starting to taper down on it, and doing well so far. Hesitancy and weakened flow has improved, but has some intermittent problems ("not like it used to be").  Impaired fasting glucose: he doesn't check sugars. He tries to limit sugar in his diet (candy, sweets, no regular sodas or sweet tea--very rare). He continues to eat ice cream every night (now using sugar-free), cut back some on alcohol (average of 1-2 drink per day, every day.  rarely drinks more (ballgames, up to 4-5)).  PMH, PSH, SH reviewed:  Outpatient Encounter Prescriptions as of 10/05/2015  Medication Sig Note  . alfuzosin (UROXATRAL) 10 MG 24 hr tablet Take 10 mg by mouth daily with breakfast.  10/05/2015: Taking every other or every third day  . aspirin 81 MG tablet Take 81 mg by mouth daily.   . fluticasone (FLONASE) 50 MCG/ACT nasal spray Place 2 sprays into  both nostrils daily. 10/05/2015: Currently using 1 spray daily  . lisinopril (PRINIVIL,ZESTRIL) 10 MG tablet TAKE ONE TABLET BY MOUTH ONCE DAILY.   . Multiple Vitamins-Minerals (MULTIVITAMIN WITH MINERALS) tablet Take 1 tablet by mouth daily.   . vitamin C (ASCORBIC ACID) 500 MG tablet Take 500 mg by mouth daily.   . [DISCONTINUED] lisinopril (PRINIVIL,ZESTRIL) 10 MG tablet TAKE ONE TABLET BY MOUTH ONCE DAILY    No facility-administered encounter medications on file as of 10/05/2015.   Allergies  Allergen Reactions  . Augmentin [Amoxicillin-Pot Clavulanate] Swelling  . Ciprofloxacin Hcl Other (See Comments)    Stated reddened skin  . Ibuprofen Itching  . Penicillins     Pruritis, joint pains,throat swelling when he took Augmentin  . Sulfa Antibiotics Rash    ROS: no fever, chills, headaches, dizziness, chest pain, palpitations, shortness of breath, nausea, vomiting, bowel changes, bleeding, bruising. Itchy rash on abdomen and trunk last month, resolved. Ringing in left ear 4 days ago, much better, only intermittent mild ringing now. URI symptoms as per HPI  PHYSICAL EXAM: BP 138/70 mmHg  Pulse 100  Temp(Src) 97.3 F (36.3 C) (Tympanic)  Ht 5\' 3"  (1.6 m)  Wt 125 lb 6.4 oz (56.881 kg)  BMI 22.22 kg/m2 124/68 on repeat by MD Well developed, pleasant male in no distress HEENT: PERRL, EOMI, conjunctiva and sclera are clear. TM's and EAC's normal. Nasal mucosa is mildly edematous, clear-white mucus noted. OP is clear. Sinuses nontender Neck: no lymphadenopathy,  thyromegaly or carotid bruit Heart: regular rate and rhythm without murmur; mildly tachycardic (had been coughing some). Lungs: clear bilaterally, no wheezes, rales, ronchi Abdomen: soft, nontender, no organomegaly or mass Extremties: no edema Skin: normal turgor, no rash Neuro: alert and oriented, cranial nerves intact, normal strength, gait Psych: normal mood, affect, hygiene and grooming  Lab Results  Component Value Date    HGBA1C 5.7 10/05/2015   (down from 6.2 in December)  ASSESSMENT/PLAN:  IFG (impaired fasting glucose) - improved; continue proper diet - Plan: HgB A1c  Essential hypertension, benign - well controlled  Prostate cancer ([place]) - doing well  Acute upper respiratory infection - supportive measures reviewed    Drink plenty of water. Use guaifenesin (found in robitussin and mucinex products). You can use the DM versions (which contain dextromethorphan, a cough suppressant, also found by itself in Delsym syrup) if your cough worsens; this is a cough suppressant. Avoid decongestants as these raise blood pressure. You can safely use Coricidin HBP (or continue benadryl if you prefer). Return if you develop high fever, worsening cough or shortness of breath, or persistent/worsening symptoms.   F/u as scheduled 12/20

## 2015-10-11 DIAGNOSIS — H524 Presbyopia: Secondary | ICD-10-CM | POA: Diagnosis not present

## 2015-10-11 DIAGNOSIS — H04123 Dry eye syndrome of bilateral lacrimal glands: Secondary | ICD-10-CM | POA: Diagnosis not present

## 2015-10-11 DIAGNOSIS — H2513 Age-related nuclear cataract, bilateral: Secondary | ICD-10-CM | POA: Diagnosis not present

## 2015-10-11 DIAGNOSIS — H43813 Vitreous degeneration, bilateral: Secondary | ICD-10-CM | POA: Diagnosis not present

## 2015-11-03 ENCOUNTER — Encounter: Payer: Self-pay | Admitting: Family Medicine

## 2015-11-03 ENCOUNTER — Ambulatory Visit (INDEPENDENT_AMBULATORY_CARE_PROVIDER_SITE_OTHER): Payer: Medicare HMO | Admitting: Family Medicine

## 2015-11-03 VITALS — BP 110/60 | HR 84 | Ht 63.0 in | Wt 121.8 lb

## 2015-11-03 DIAGNOSIS — J309 Allergic rhinitis, unspecified: Secondary | ICD-10-CM

## 2015-11-03 DIAGNOSIS — H6121 Impacted cerumen, right ear: Secondary | ICD-10-CM | POA: Diagnosis not present

## 2015-11-03 NOTE — Progress Notes (Signed)
   Subjective:    Patient ID: Gary Blair, male    DOB: January 16, 1951, 65 y.o.   MRN: [id number]  HPI He is here for evaluation of difficulty hearing mainly on the right side. He does have a previous history of cerumen impaction. He does have underlying allergies and presently is taking Flonase. He does have some slight nasal congestion and popping in his ears but no fever, chills, sore throat.   Review of Systems     Objective:   Physical Exam Left TM and canal is normal. Right TM and canal after cerumen was removed appears normal. Neck is supple without adenopathy. Throat clear.       Assessment & Plan:  Cerumen impaction, right  Allergic rhinitis, unspecified allergic rhinitis type Reassured him that I did not find in a long. Did recommend possibly using a decongestant to help since it sounds as if he might have some eustachian tube dysfunction.

## 2015-12-04 ENCOUNTER — Other Ambulatory Visit: Payer: Self-pay | Admitting: Family Medicine

## 2015-12-29 DIAGNOSIS — C61 Malignant neoplasm of prostate: Secondary | ICD-10-CM | POA: Diagnosis not present

## 2016-01-05 DIAGNOSIS — C61 Malignant neoplasm of prostate: Secondary | ICD-10-CM | POA: Diagnosis not present

## 2016-01-05 DIAGNOSIS — N320 Bladder-neck obstruction: Secondary | ICD-10-CM | POA: Diagnosis not present

## 2016-01-23 DIAGNOSIS — Z23 Encounter for immunization: Secondary | ICD-10-CM | POA: Diagnosis not present

## 2016-02-02 DIAGNOSIS — D2271 Melanocytic nevi of right lower limb, including hip: Secondary | ICD-10-CM | POA: Diagnosis not present

## 2016-02-02 DIAGNOSIS — L570 Actinic keratosis: Secondary | ICD-10-CM | POA: Diagnosis not present

## 2016-02-02 DIAGNOSIS — L821 Other seborrheic keratosis: Secondary | ICD-10-CM | POA: Diagnosis not present

## 2016-02-02 DIAGNOSIS — D2261 Melanocytic nevi of right upper limb, including shoulder: Secondary | ICD-10-CM | POA: Diagnosis not present

## 2016-02-02 DIAGNOSIS — Z85828 Personal history of other malignant neoplasm of skin: Secondary | ICD-10-CM | POA: Diagnosis not present

## 2016-02-02 DIAGNOSIS — D225 Melanocytic nevi of trunk: Secondary | ICD-10-CM | POA: Diagnosis not present

## 2016-02-02 DIAGNOSIS — D2262 Melanocytic nevi of left upper limb, including shoulder: Secondary | ICD-10-CM | POA: Diagnosis not present

## 2016-03-16 IMAGING — CR DG CHEST 2V
2 series · 2 of 2 positions shown · non-contrast
Comparison: Chest x-ray of 04/08/2013

CLINICAL DATA: Former smoker, preop for seed implantation for
prostate carcinoma

EXAM:
CHEST  2 VIEW

[w chest pa]
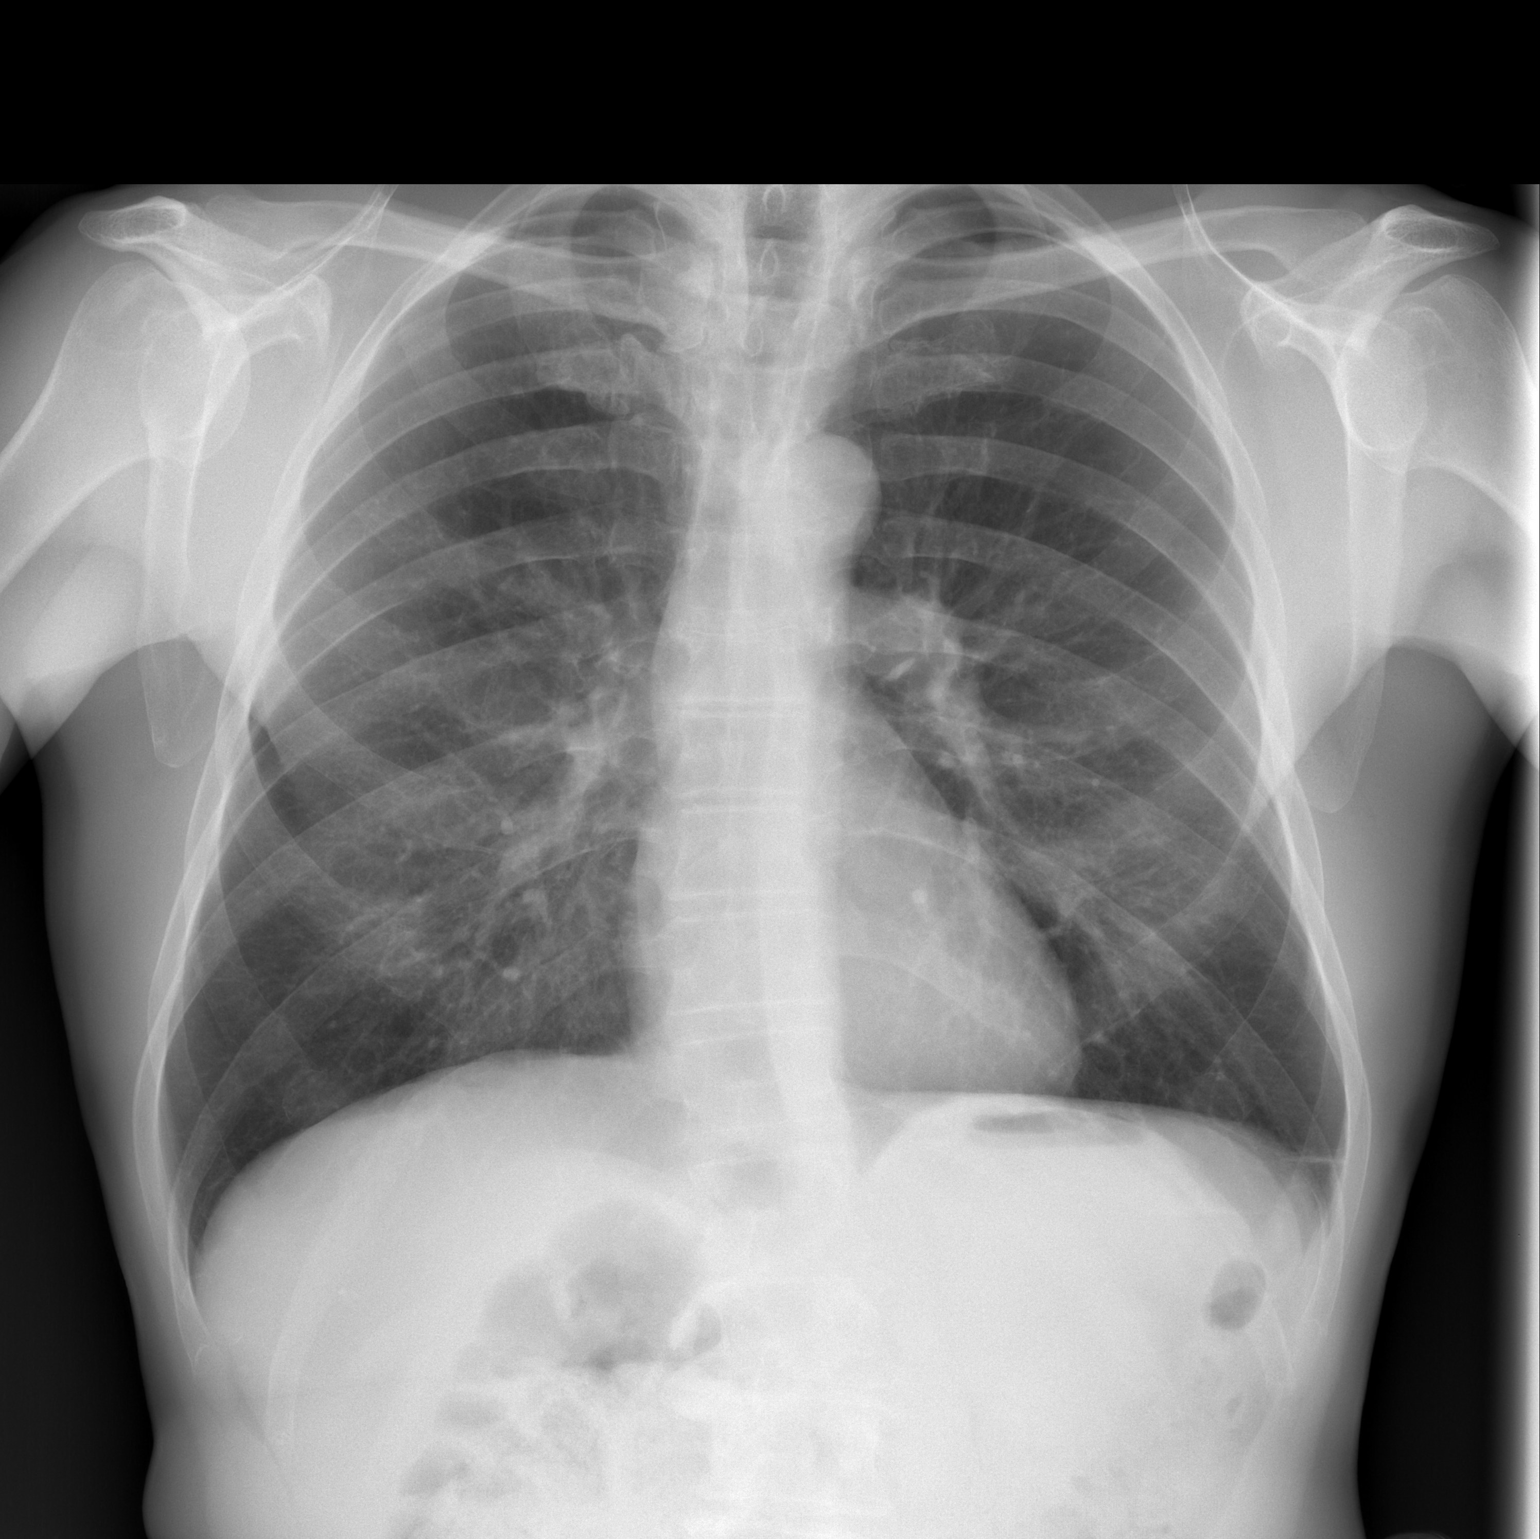

[w chest lat]
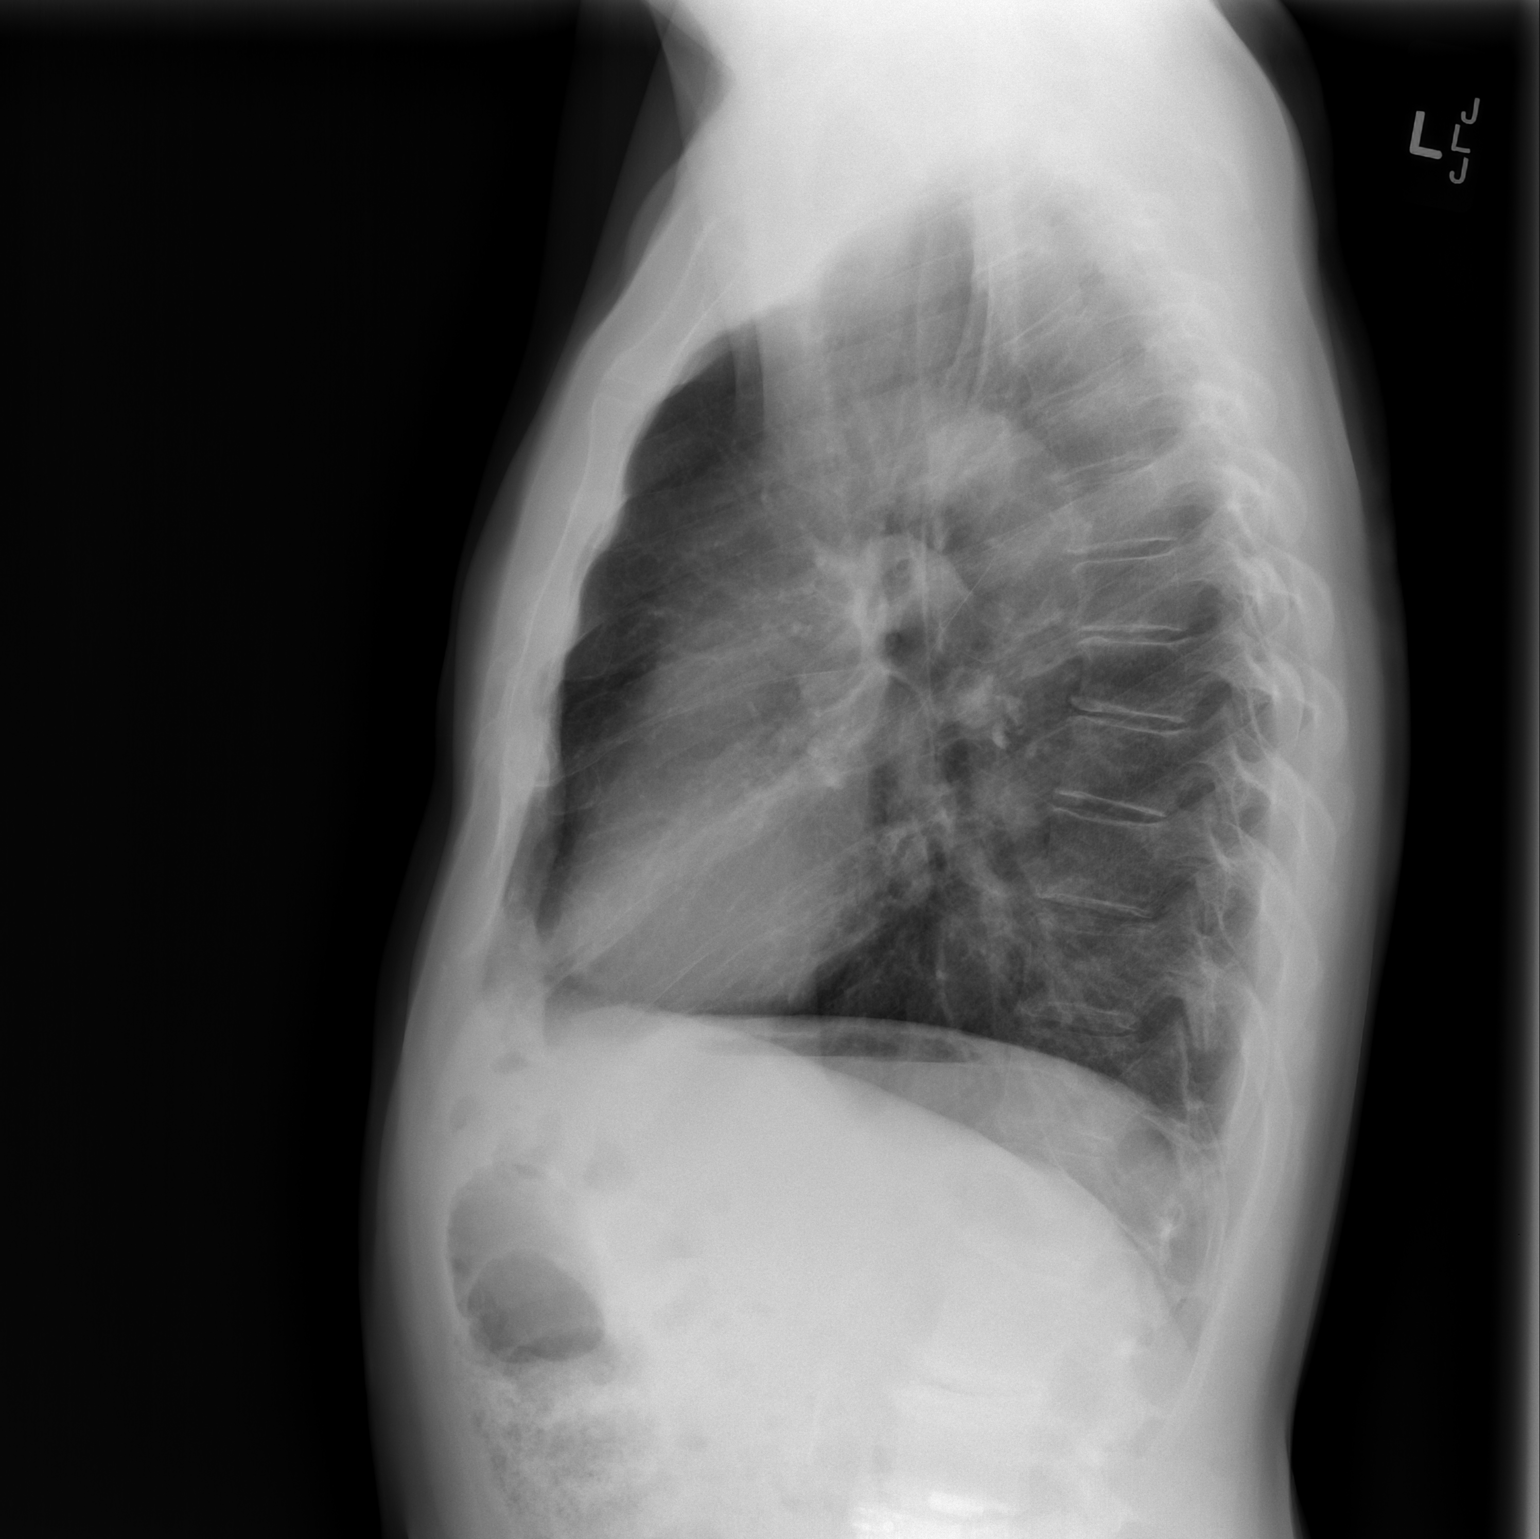

[2 of 2 positions shown; findings below may reference images not displayed]

FINDINGS: No active infiltrate or effusion is seen. Mediastinal and hilar
contours are unremarkable. The heart is within normal limits in
size. No bony abnormality is seen.
IMPRESSION: No active cardiopulmonary disease.

## 2016-04-16 NOTE — Progress Notes (Signed)
Chief Complaint  Patient presents with  . Medicare Wellness    fasting AWV/med check plus. No concerns.    Gary Blair is a 65 y.o. male who presents for annual wellness visit and follow-up on chronic medical conditions.   He has sore throat intermittently at night, with some postnasal drainage.  Sometimes can cough up phlegm in the morning (slightly yellow). +runny nose. He uses Flonase which helps some.  Uses 1-2 sprays.  These symptoms are "minor" and don't bother him too much (noted on his ROS forms).  Patient presents for follow up on hypertension. He was started on lisinopril in December 2015. He checks his blood pressure every 1-2 weeks at pharmacy. BP's have been running 120's-mid 130's/60's-70's.  Denies any side effects or cough. Denies headaches, dizziness, chest pain, palpitations, edema.    Prostate Cancer: He is s/p brachytherapy 09/2014. He last saw urologist in September 2017, and PSA was 0.56.  Urinary outflow symptoms iimproved, no longer taking uroxatral. He didn't notice a difference with hesitancy and weakened flow when on the medication, so it was stopped.  Has not gotten any worse, and is manageable.  Impaired fasting glucose: he doesn't check sugars. He tries to limit sugar in his diet (candy, sweets, no regular sodas or sweet tea--very rare). He continues to eat ice cream every night (now using sugar-free), cut back some on alcohol (average of 1-2 drink per day, every day.  rarely drinks more (ballgames, up to 4-5)).   Immunization History  Administered Date(s) Administered  . Influenza Split 03/16/2005, 02/15/2006, 02/01/2007, 02/25/2008, 04/20/2009, 01/28/2012  . Influenza Whole 02/09/2013  . Influenza, High Dose Seasonal PF 01/23/2016  . Influenza,trivalent, recombinat, inj, PF 01/08/2014  . Influenza-Unspecified 01/01/2015  . Pneumococcal Conjugate-13 04/11/2014  . Pneumococcal Polysaccharide-23 12/14/2004, 04/17/2015  . Td 06/27/1989, 09/30/2001  .  Tdap 05/20/2015  . Zoster 04/26/2011   Last colonoscopy: 03/2014--Dr. Erlene Quan, showed adenomatous colon polyps.  He reports he is due again 03/2017 Last PSA: per urologist, UTD Dentist: twice yearly  Ophtho: yearly, UTD Exercise: unloading trucks, active on job, yardwork. He has [employer], uses it 5d/wk (15-20 mins straight, keeps heart rate up) Lipids: Lab Results  Component Value Date   CHOL 177 04/11/2014   HDL 69 04/11/2014   LDLCALC 98 04/11/2014   TRIG 48 04/11/2014   CHOLHDL 2.6 04/11/2014    Other doctors caring for patient include: Ophtho: Dr. Satira Sark Derm: Dr. Ubaldo Glassing Dentist: Dr. Imagene Riches GI: Dr. Erlene Quan ([facility], in [place]) Urology: Dr. Diona Fanti (Radiation onc: Dr. Valere Dross)  Depression screen: negative Fall screen: negative Functional status screen:  Notable only for some difficulty hearing intermittently over the last 3 years--related to cerumen impaction, intermittent. Sometimes notices some plugging in the morning.  See full screen in Epic.  End of Life Discussion:  Patient has a living will and medical power of attorney (and brought in 03/2015)  Past Medical History:  Diagnosis Date  . Arthritis   . At risk for sleep apnea    STOP-BANG= 4        SENT TO PCP 10-25-2014  . Borderline hyperlipidemia   . Carpal tunnel syndrome on both sides    right > left  . Diverticulosis of colon    moderate  . History of adenomatous polyp of colon    2006,  2010,  tubular adenoma 04-27-2014 due again 03/2017  . History of chronic sinusitis   . History of gastric ulcer    yrs ago  . History of  gastroesophageal reflux (GERD)   . Hypertension   . Prostate cancer [facility]) dx 06/2014   urologist-  Dr. Diona Fanti--  Stage T1c,  Gleason (3+3) 6,  vol. 25.84m  . S/P radiation therapy 10/27/14   Prostate 14,500 cGy implanting 67 I-125 seeds utilizing 31 needles. Individual seed activity 0.363 mCi per seed for a total implant activity of 24.32 mCi.  . Wears glasses     Past  Surgical History:  Procedure Laterality Date  . CERVICAL LAMINECTOMY  1984  . COLONOSCOPY  last one 04-27-2014  . PROSTATE BIOPSY  07/20/14  . RADIOACTIVE SEED IMPLANT N/A 10/27/2014   Procedure: RADIOACTIVE SEED IMPLANT/BRACHYTHERAPY IMPLANT;  Surgeon: SFranchot Gallo MD;  [facility]  Service: Urology;  Laterality: N/A;  67 seeds implanted no seeds found in bladder   . TONSILLECTOMY  child    Social History   Social History  . Marital status: Married    Spouse name: N/A  . Number of children: 1  . Years of education: N/A   Occupational History  . Truck driver [employer]   Social History Main Topics  . Smoking status: Former Smoker    Packs/day: 0.50    Years: 45.00    Types: Cigarettes    Quit date: 03/29/2013  . Smokeless tobacco: Never Used  . Alcohol use 8.4 oz/week    14 Cans of beer per week     Comment: average of 1-2 drink per day, every day.  rarely drinks more (ballgames, up to 4-5)  . Drug use: No  . Sexual activity: Not Currently    Partners: Female     Comment: ED   Other Topics Concern  . Not on file   Social History Narrative   Married. No pets.  [place] lives in [place]   Family History  Problem Relation Age of Onset  . Heart attack Mother     deceased age 67 . Stroke Father     deceased age 67 . Hypertension Father   . Heart attack Brother   . Diabetes Neg Hx   . Prostate cancer Father     prostate in his late 754's   Outpatient Encounter Prescriptions as of 04/17/2016  Medication Sig Note  . aspirin 81 MG tablet Take 81 mg by mouth daily.   . fluticasone (FLONASE) 50 MCG/ACT nasal spray Place 2 sprays into both nostrils daily. 04/17/2016: Uses 1-2 sprays/d  . lisinopril (PRINIVIL,ZESTRIL) 10 MG tablet TAKE ONE TABLET BY MOUTH ONCE DAILY   . Multiple Vitamins-Minerals (MULTIVITAMIN WITH MINERALS) tablet Take 1 tablet by mouth daily.   . vitamin C (ASCORBIC ACID) 500 MG tablet Take  500 mg by mouth daily.   . [DISCONTINUED] alfuzosin (UROXATRAL) 10 MG 24 hr tablet Take 10 mg by mouth daily with breakfast. Reported on 11/03/2015 10/05/2015: Taking every other or every third day  . [DISCONTINUED] lisinopril (PRINIVIL,ZESTRIL) 10 MG tablet TAKE ONE TABLET BY MOUTH ONCE DAILY.    No facility-administered encounter medications on file as of 04/17/2016.     Allergies  Allergen Reactions  . Augmentin [Amoxicillin-Pot Clavulanate] Swelling  . Ciprofloxacin Hcl Other (See Comments)    Stated reddened skin  . Ibuprofen Itching  . Penicillins     Pruritis, joint pains,throat swelling when he took Augmentin  . Sulfa Antibiotics Rash    ROS: The patient denies fever, chills, weight changes. Denies headaches, vision loss, ear pain, chest pain, palpitations, dizziness, syncope,  dyspnea on exertion, cough (just in mornings from PND), swelling, constipation, abdominal pain, melena, hematochezia, hematuria, genital lesions, weakness, tremor, suspicious skin lesions, depression, anxiety, abnormal bleeding/bruising, or enlarged lymph nodes. Wears braces at night bilaterally for CTS--has tingling/numbness occasionally even when wearing braces. He denies any pain or weakness.  No daytime symptoms. Chronic PND and throat problems as per HPI. Some hesitancy and weakened stream, unchanged.  No nocturia.  Slight burning with urination, more noticeable in the morning (also mentioned to urologist, not any worse/different since then). +ED Intermittent decreased hearing related to cerumen Some blurred vision, related to dry eyes.  Vision is corrected with his glasses, and uses drops which help. Some red areas developed on his chest in the last couple of months.  He sees derm regularly (last went around June).   PHYSICAL EXAM:  BP 132/88 (BP Location: Left Arm, Patient Position: Sitting, Cuff Size: Normal)   Pulse 84   Ht 5' 2.25" (1.581 m)   Wt 122 lb 6.4 oz (55.5 kg)   BMI 22.21 kg/m   132/68  on repeat by MD  General Appearance:  Alert, cooperative, no distress, appears stated age   Head:  Normocephalic, without obvious abnormality, atraumatic   Eyes:  PERRL, conjunctiva/corneas clear, EOM's intact, fundi benign   Ears:  Normal TM's and external ear canals  Nose:  Nares normal, mucosa is mildly edematous, small amount of clear mucus; no sinus tenderness   Throat:  Lips, mucosa, and tongue normal; teeth and gums normal. OP is without erythema, exudates, lesions  Neck:  Supple, no lymphadenopathy; thyroid: no enlargement/tenderness/nodules; no carotid bruit or JVD.   Back:  Spine nontender, no curvature, ROM normal, no CVA tenderness   Lungs:  Clear to auscultation bilaterally without wheezes, rales or ronchi; respirations unlabored   Chest Wall:  No tenderness or deformity   Heart:  Regular rate and rhythm, S1 and S2 normal, no murmur, rub or gallop   Breast Exam:  No chest wall tenderness, masses or gynecomastia   Abdomen:  Soft, non-tender, nondistended, normoactive bowel sounds, no masses, no hepatosplenomegaly   Genitalia:  Circumcised, no lesions; testicles normal bilaterally without mass; no inguinal hernia  Rectal:  Deferred to urologist  Extremities:  No clubbing, cyanosis or edema   Pulses:  2+ and symmetric all extremities   Skin:  Skin color, texture, turgor normal, no rashes. White hyperkeratotic lesion at right ankle (c/w AK); 1-2 scattered small areas of blotchy erythema on chest, not raised  Lymph nodes:  Cervical, supraclavicular, and axillary nodes normal   Neurologic:  CNII-XII intact, normal strength, sensation and gait; reflexes 2+ and symmetric throughout    Psych:   Normal mood, affect, hygiene and grooming   ASSESSMENT/PLAN:  Medicare annual wellness visit, subsequent - Plan: POCT Urinalysis Dipstick  Essential hypertension, benign - controlled - Plan:  Comprehensive metabolic panel, Lipid panel  IFG (impaired fasting glucose) - Plan: Hemoglobin A1c, Comprehensive metabolic panel  Chronic non-seasonal allergic rhinitis, unspecified trigger - continue flonase, reviewed proper technique - Plan: fluticasone (FLONASE) 50 MCG/ACT nasal spray   A1c, c-met, lipids  Recommended at least 30 minutes of aerobic activity at least 5 days/week; proper sunscreen use reviewed; healthy diet and alcohol recommendations (less than or equal to 2 drinks/day) reviewed; regular seatbelt use; changing batteries in smoke detectors. Self-testicular exams. Immunization recommendations discussed--UTD; continue yearly flu shots. Shingrix recommended when available. Colonoscopy recommendations reviewed--UTD, due again 2018.  F/u 1 year, sooner prn for concerns, or if BP's are  consistently >135/85   Medicare Attestation I have personally reviewed: The patient's medical and social history Their use of alcohol, tobacco or illicit drugs Their current medications and supplements The patient's functional ability including ADLs,fall risks, home safety risks, cognitive, and hearing and visual impairment Diet and physical activities Evidence for depression or mood disorders  The patient's weight, height, and BMI have been recorded in the chart.  I have made referrals, counseling, and provided education to the patient based on review of the above and I have provided the patient with a written personalized care plan for preventive services.     Joannah Gitlin A, MD   04/16/2016

## 2016-04-17 ENCOUNTER — Ambulatory Visit (INDEPENDENT_AMBULATORY_CARE_PROVIDER_SITE_OTHER): Payer: Medicare HMO | Admitting: Family Medicine

## 2016-04-17 ENCOUNTER — Encounter: Payer: Self-pay | Admitting: Family Medicine

## 2016-04-17 VITALS — BP 132/68 | HR 84 | Ht 62.25 in | Wt 122.4 lb

## 2016-04-17 DIAGNOSIS — J3089 Other allergic rhinitis: Secondary | ICD-10-CM | POA: Diagnosis not present

## 2016-04-17 DIAGNOSIS — I10 Essential (primary) hypertension: Secondary | ICD-10-CM

## 2016-04-17 DIAGNOSIS — R7301 Impaired fasting glucose: Secondary | ICD-10-CM | POA: Diagnosis not present

## 2016-04-17 DIAGNOSIS — Z0000 Encounter for general adult medical examination without abnormal findings: Secondary | ICD-10-CM

## 2016-04-17 LAB — COMPREHENSIVE METABOLIC PANEL
ALT: 21 U/L (ref 9–46)
AST: 24 U/L (ref 10–35)
Albumin: 4.1 g/dL (ref 3.6–5.1)
Alkaline Phosphatase: 57 U/L (ref 40–115)
BUN: 14 mg/dL (ref 7–25)
CALCIUM: 9.2 mg/dL (ref 8.6–10.3)
CHLORIDE: 106 mmol/L (ref 98–110)
CO2: 23 mmol/L (ref 20–31)
Creat: 0.96 mg/dL (ref 0.70–1.25)
Glucose, Bld: 105 mg/dL — ABNORMAL HIGH (ref 65–99)
POTASSIUM: 4.4 mmol/L (ref 3.5–5.3)
Sodium: 140 mmol/L (ref 135–146)
TOTAL PROTEIN: 6.4 g/dL (ref 6.1–8.1)
Total Bilirubin: 0.8 mg/dL (ref 0.2–1.2)

## 2016-04-17 LAB — POCT URINALYSIS DIPSTICK
BILIRUBIN UA: NEGATIVE
Glucose, UA: NEGATIVE
KETONES UA: NEGATIVE
Leukocytes, UA: NEGATIVE
Nitrite, UA: NEGATIVE
PH UA: 7
Protein, UA: NEGATIVE
RBC UA: NEGATIVE
SPEC GRAV UA: 1.01
Urobilinogen, UA: NEGATIVE

## 2016-04-17 LAB — LIPID PANEL
CHOLESTEROL: 178 mg/dL (ref <200)
HDL: 69 mg/dL (ref >40)
LDL Cholesterol: 97 mg/dL (ref <100)
TRIGLYCERIDES: 58 mg/dL (ref <150)
Total CHOL/HDL Ratio: 2.6 Ratio (ref <5.0)
VLDL: 12 mg/dL (ref <30)

## 2016-04-17 MED ORDER — FLUTICASONE PROPIONATE 50 MCG/ACT NA SUSP: 2.0000 | Freq: Every day | NASAL | 11 refills | Status: DC

## 2016-04-17 NOTE — Patient Instructions (Addendum)
  HEALTH MAINTENANCE RECOMMENDATIONS:  It is recommended that you get at least 30 minutes of aerobic exercise at least 5 days/week (for weight loss, you may need as much as 60-90 minutes). This can be any activity that gets your heart rate up. This can be divided in 10-15 minute intervals if needed, but try and build up your endurance at least once a week.  Weight bearing exercise is also recommended twice weekly.  Eat a healthy diet with lots of vegetables, fruits and fiber.  "Colorful" foods have a lot of vitamins (ie green vegetables, tomatoes, red peppers, etc).  Limit sweet tea, regular sodas and alcoholic beverages, all of which has a lot of calories and sugar.  Up to 2 alcoholic drinks daily may be beneficial for men (unless trying to lose weight, watch sugars).  Drink a lot of water.  Sunscreen of at least SPF 30 should be used on all sun-exposed parts of the skin when outside between the hours of 10 am and 4 pm (not just when at beach or pool, but even with exercise, golf, tennis, and yard work!)  Use a sunscreen that says "broad spectrum" so it covers both UVA and UVB rays, and make sure to reapply every 1-2 hours.  Remember to change the batteries in your smoke detectors when changing your clock times in the spring and fall.  Use your seat belt every time you are in a car, and please drive safely and not be distracted with cell phones and texting while driving.   Gary Blair , Thank you for taking time to come for your Medicare Wellness Visit. I appreciate your ongoing commitment to your health goals. Please review the following plan we discussed and let me know if I can assist you in the future.   These are the goals we discussed: Goals    None      This is a list of the screening recommended for you and due dates:  Health Maintenance  Topic Date Due  . Colon Cancer Screening  04/27/2017  . Tetanus Vaccine  05/19/2025  . Flu Shot  Completed  . Shingles Vaccine  Completed  .   Hepatitis C: One time screening is recommended by Center for Disease Control  (CDC) for  adults born from [birth year] through 1967.   Completed  . Pneumonia vaccines  Completed   I recommend getting the new shingles vaccine (Shingrix) when available. You will need to check with your insurance to see if it is covered, and if covered by Medicare Part D, you need to get from the pharmacy rather than our office (if you have Part D).  It is a series of 2 injections, spaced 2 months apart.   Return sooner than 1 year if BP's are consistently over 135/85. Currently they seem to be okay when checked elsewhere, per your report.  Consider bringing in your paper with all of your readings to your next visit (and if you don't keep one, you should).

## 2016-04-18 LAB — HEMOGLOBIN A1C
Hgb A1c MFr Bld: 5.6 % (ref <5.7)
Mean Plasma Glucose: 114 mg/dL

## 2016-05-22 ENCOUNTER — Telehealth: Payer: Self-pay | Admitting: Family Medicine

## 2016-05-22 NOTE — Telephone Encounter (Signed)
Pt called and is wanting to know who you recommend for a hand specliacist In [place] , the one he normally goes to has retired and he needs a knew one, states it has been years since he has been to that one, and that you offered a referral a couple of years ago, pt can be reached at [phone]

## 2016-05-22 NOTE — Telephone Encounter (Signed)
Guessing that perhaps he saw Dr. Daylene Katayama (who retired).  The other folks at his office (ie Dr. Burney Gauze) are also fine to see.  The other recommendation would be Dr. Amedeo Plenty at [facility] ortho. Please advise.

## 2016-05-23 NOTE — Telephone Encounter (Signed)
Patient advised.

## 2016-05-23 NOTE — Telephone Encounter (Signed)
Left message for patient to return my call.

## 2016-06-04 ENCOUNTER — Other Ambulatory Visit: Payer: Self-pay | Admitting: Family Medicine

## 2016-06-11 DIAGNOSIS — R69 Illness, unspecified: Secondary | ICD-10-CM | POA: Diagnosis not present

## 2016-06-20 DIAGNOSIS — M79642 Pain in left hand: Secondary | ICD-10-CM | POA: Diagnosis not present

## 2016-06-20 DIAGNOSIS — G5603 Carpal tunnel syndrome, bilateral upper limbs: Secondary | ICD-10-CM | POA: Diagnosis not present

## 2016-06-20 DIAGNOSIS — M79641 Pain in right hand: Secondary | ICD-10-CM | POA: Diagnosis not present

## 2016-06-27 HISTORY — PX: CARPAL TUNNEL RELEASE: SHX101

## 2016-07-05 DIAGNOSIS — N320 Bladder-neck obstruction: Secondary | ICD-10-CM | POA: Diagnosis not present

## 2016-07-05 DIAGNOSIS — C61 Malignant neoplasm of prostate: Secondary | ICD-10-CM | POA: Diagnosis not present

## 2016-07-09 DIAGNOSIS — M79641 Pain in right hand: Secondary | ICD-10-CM | POA: Diagnosis not present

## 2016-07-09 DIAGNOSIS — G5603 Carpal tunnel syndrome, bilateral upper limbs: Secondary | ICD-10-CM | POA: Diagnosis not present

## 2016-07-09 DIAGNOSIS — M79642 Pain in left hand: Secondary | ICD-10-CM | POA: Diagnosis not present

## 2016-07-09 DIAGNOSIS — M542 Cervicalgia: Secondary | ICD-10-CM | POA: Diagnosis not present

## 2016-07-24 DIAGNOSIS — G5601 Carpal tunnel syndrome, right upper limb: Secondary | ICD-10-CM | POA: Diagnosis not present

## 2016-10-29 DIAGNOSIS — H2513 Age-related nuclear cataract, bilateral: Secondary | ICD-10-CM | POA: Diagnosis not present

## 2016-10-29 DIAGNOSIS — H52203 Unspecified astigmatism, bilateral: Secondary | ICD-10-CM | POA: Diagnosis not present

## 2016-10-29 DIAGNOSIS — H524 Presbyopia: Secondary | ICD-10-CM | POA: Diagnosis not present

## 2016-12-03 ENCOUNTER — Other Ambulatory Visit: Payer: Self-pay | Admitting: Family Medicine

## 2016-12-31 DIAGNOSIS — R69 Illness, unspecified: Secondary | ICD-10-CM | POA: Diagnosis not present

## 2017-02-01 DIAGNOSIS — R69 Illness, unspecified: Secondary | ICD-10-CM | POA: Diagnosis not present

## 2017-04-09 DIAGNOSIS — D2271 Melanocytic nevi of right lower limb, including hip: Secondary | ICD-10-CM | POA: Diagnosis not present

## 2017-04-09 DIAGNOSIS — D485 Neoplasm of uncertain behavior of skin: Secondary | ICD-10-CM | POA: Diagnosis not present

## 2017-04-09 DIAGNOSIS — L720 Epidermal cyst: Secondary | ICD-10-CM | POA: Diagnosis not present

## 2017-04-09 DIAGNOSIS — L821 Other seborrheic keratosis: Secondary | ICD-10-CM | POA: Diagnosis not present

## 2017-04-09 DIAGNOSIS — L570 Actinic keratosis: Secondary | ICD-10-CM | POA: Diagnosis not present

## 2017-04-09 DIAGNOSIS — D2261 Melanocytic nevi of right upper limb, including shoulder: Secondary | ICD-10-CM | POA: Diagnosis not present

## 2017-04-09 DIAGNOSIS — D225 Melanocytic nevi of trunk: Secondary | ICD-10-CM | POA: Diagnosis not present

## 2017-04-09 DIAGNOSIS — D2262 Melanocytic nevi of left upper limb, including shoulder: Secondary | ICD-10-CM | POA: Diagnosis not present

## 2017-04-09 DIAGNOSIS — Z85828 Personal history of other malignant neoplasm of skin: Secondary | ICD-10-CM | POA: Diagnosis not present

## 2017-04-09 DIAGNOSIS — D2272 Melanocytic nevi of left lower limb, including hip: Secondary | ICD-10-CM | POA: Diagnosis not present

## 2017-04-11 DIAGNOSIS — C61 Malignant neoplasm of prostate: Secondary | ICD-10-CM | POA: Diagnosis not present

## 2017-04-23 ENCOUNTER — Ambulatory Visit: Payer: Medicare HMO | Admitting: Family Medicine

## 2017-05-06 ENCOUNTER — Encounter: Payer: Self-pay | Admitting: Family Medicine

## 2017-05-24 NOTE — Progress Notes (Signed)
Chief Complaint  Patient presents with  . Medicare Wellness    fasting AWV, no concerns.    Gary Blair is a 67 y.o. male who presents for annual wellness visit and follow-up on chronic medical conditions.  He has the following concerns:  He has continued postnasal drainage, with sore throat at night. Once he wakes up and coughs it up, he is fine.  He uses flonase daily at night He has taken Benadryl at night, hasn't noticed an improvement.  He does notice that the Flonase helps with opening his nasal passages. Not terribly bothersome, just point this out. He previously had testing for reflux, which was reportedly negative.  Hypertension. He was started on lisinopril in December 2015. He checks his blood pressure every 1-2 weeks at pharmacy, but in the last 6 months cut back to just once a month since they were all fine. BP's have been running 98-141/66-83, averaging 119/74 (and mostly under 135, only twice over 140, under 130 more than half the time.  Denies any side effects or cough. Denies headaches, dizziness, chest pain, palpitations, edema.    Prostate Cancer: He is s/p brachytherapy 09/2014. He last saw urologist in 03/2017, doing well.  He is supposed to f/u in 9 months. He denies any significant urinary complaints.  Impaired fasting glucose: he doesn't check sugars. He tries to limit sugar in his diet (candy, sweets, no regular sodas or sweet tea--very rare). He continues to eat ice cream every night (now using sugar-free), cut back some on alcohol (average of 1-2 drink per day, every day. rarely drinks more (ballgames, up to 4-5)).   Immunization History  Administered Date(s) Administered  . Influenza Split 03/16/2005, 02/15/2006, 02/01/2007, 02/25/2008, 04/20/2009, 01/28/2012  . Influenza Whole 02/09/2013  . Influenza, High Dose Seasonal PF 01/23/2016  . Influenza,trivalent, recombinat, inj, PF 01/08/2014  . Influenza-Unspecified 01/01/2015  . Pneumococcal Conjugate-13  04/11/2014  . Pneumococcal Polysaccharide-23 12/14/2004, 04/17/2015  . Td 06/27/1989, 09/30/2001  . Tdap 05/20/2015  . Zoster 04/26/2011   Last colonoscopy: 03/2014--Dr. Erlene Quan, showed adenomatous colon polyps. He was due again 03/2017, needs to schedule Last PSA: per urologist, UTD (03/2017) Dentist: twice yearly  Ophtho: yearly, UTD Exercise: unloading trucks, active on job, Haematologist. He has [employer], uses it 3-5d/wk (15-20 mins straight, keeps heart rate up). 1.5-2.5 miles walking at work per his Health app on his phone. Lipids: Lab Results  Component Value Date   CHOL 178 04/17/2016   HDL 69 04/17/2016   LDLCALC 97 04/17/2016   TRIG 58 04/17/2016   CHOLHDL 2.6 04/17/2016    Other doctors caring for patient include: Ophtho: Dr. Claudean Kinds Derm: Dr. Ubaldo Glassing Dentist: Dr. Imagene Riches GI: Dr. Erlene Quan ([facility], in [place]) Urology: Dr. Diona Fanti (Radiation onc: Dr. Valere Dross)  Depression screen: negative Fall screen: negative Functional status screen: unremarkable. See full screen in Epic.  End of Life Discussion: Patient hasa living will and medical power of attorney (and brought in 03/2015)  Past Medical History:  Diagnosis Date  . Arthritis   . At risk for sleep apnea    STOP-BANG= 4        SENT TO PCP 10-25-2014  . Borderline hyperlipidemia   . Carpal tunnel syndrome on both sides    right > left  . Diverticulosis of colon    moderate  . History of adenomatous polyp of colon    2006,  2010,  tubular adenoma 04-27-2014 due again 03/2017  . History of chronic sinusitis   . History of  gastric ulcer    yrs ago  . History of gastroesophageal reflux (GERD)   . Hypertension   . Prostate cancer [facility]) dx 06/2014   urologist-  Dr. Diona Fanti--  Stage T1c,  Gleason (3+3) 6,  vol. 25.81m  . S/P radiation therapy 10/27/14   Prostate 14,500 cGy implanting 67 I-125 seeds utilizing 31 needles. Individual seed activity 0.363 mCi per seed for a total implant activity of 24.32  mCi.  . Wears glasses     Past Surgical History:  Procedure Laterality Date  . CERVICAL LAMINECTOMY  1984  . COLONOSCOPY  last one 04-27-2014  . PROSTATE BIOPSY  07/20/14  . RADIOACTIVE SEED IMPLANT N/A 10/27/2014   Procedure: RADIOACTIVE SEED IMPLANT/BRACHYTHERAPY IMPLANT;  Surgeon: SFranchot Gallo MD;  [facility]  Service: Urology;  Laterality: N/A;  67 seeds implanted no seeds found in bladder   . TONSILLECTOMY  child    Social History   Socioeconomic History  . Marital status: Married    Spouse name: Not on file  . Number of children: 1  . Years of education: Not on file  . Highest education level: Not on file  Social Needs  . Financial resource strain: Not on file  . Food insecurity - worry: Not on file  . Food insecurity - inability: Not on file  . Transportation needs - medical: Not on file  . Transportation needs - non-medical: Not on file  Occupational History  . 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 Smoking status: Former Smoker    Packs/day: 0.50    Years: 45.00    Pack years: 22.50    Types: Cigarettes    Last attempt to quit: 03/29/2013    Years since quitting: 4.1  . Smokeless tobacco: Never Used  Substance and Sexual Activity  . Alcohol use: Yes    Alcohol/week: 8.4 oz    Types: 14 Cans of beer per week    Comment: average of 1-2 drink per day, every day.  rarely drinks more (ballgames, up to 4-5)  . Drug use: No  . Sexual activity: Not Currently    Partners: Female    Comment: ED  Other Topics Concern  . Not on file  Social History Narrative   Married. No pets.  Daughter lives in [place]   Family History  Problem Relation Age of Onset  . Heart attack Mother        deceased age 69 . Stroke Father        deceased age 979 . Hypertension Father   . Prostate cancer Father        prostate in his late 727's . Heart attack Brother   . Diabetes Neg Hx      Outpatient Encounter Medications as of 05/26/2017  Medication Sig  . aspirin 81 MG tablet Take 81 mg by mouth daily.  . fluticasone (FLONASE) 50 MCG/ACT nasal spray Place 2 sprays into both nostrils daily.  .Marland Kitchenlisinopril (PRINIVIL,ZESTRIL) 10 MG tablet TAKE ONE TABLET BY MOUTH ONCE DAILY  . Multiple Vitamins-Minerals (MULTIVITAMIN WITH MINERALS) tablet Take 1 tablet by mouth daily.  . vitamin C (ASCORBIC ACID) 500 MG tablet Take 500 mg by mouth daily.   No facility-administered encounter medications on file as of 05/26/2017.     Allergies  Allergen Reactions  . Augmentin [Amoxicillin-Pot Clavulanate] Swelling  . Ciprofloxacin Hcl Other (See Comments)  Stated reddened skin  . Ibuprofen Itching  . Penicillins     Pruritis, joint pains,throat swelling when he took Augmentin  . Sulfa Antibiotics Rash     ROS: The patient denies fever, chills, weight changes. Denies headaches, vision loss, ear pain, chest pain, palpitations, dizziness, syncope, dyspnea on exertion, cough (just in mornings from PND), swelling, constipation, abdominal pain, melena, hematochezia, hematuria, genital lesions, weakness, tremor, suspicious skin lesions (sees derm regularly; AK removed from left ear in 03/2017), depression, anxiety, abnormal bleeding/bruising, or enlarged lymph nodes.  S/p R CTS.  He still intermittently wears brace at night on his left.  Notes some right shoulder pain x 2 month--pain is intermittent (reaching back, not every time). He also has some intermittent low back and knee pain.  Rarely requires any medication (tylenol). Chronic PND, Flonase helps. Some hesitancy and weakened stream, unchanged.  No nocturia. +ED   PHYSICAL EXAM:  BP 126/80   Pulse 92   Ht '5\' 3"'  (1.6 m)   Wt 121 lb 6.4 oz (55.1 kg)   BMI 21.51 kg/m   Wt Readings from Last 3 Encounters:  05/26/17 121 lb 6.4 oz (55.1 kg)  04/17/16 122 lb 6.4 oz (55.5 kg)  11/03/15 121 lb 12.8 oz (55.2 kg)    General  Appearance:  Alert, cooperative, no distress, appears stated age   Head:  Normocephalic, without obvious abnormality, atraumatic   Eyes:  PERRL, conjunctiva/corneas clear, EOM's intact, fundi benign   Ears:  Normal TM's and external ear canals  Nose:  Nares normal, mucosais mildly edematous, small amount of clear mucus; no sinus tenderness. Scab left septum; septum deviated to the right.  Throat:  Lips, mucosa, and tongue normal; teeth and gums normal. OP is without erythema, exudates, lesions  Neck:  Supple, no lymphadenopathy; thyroid: no enlargement/tenderness/nodules; no carotid bruit or JVD.   Back:  Spine nontender, no curvature, ROM normal, no CVA tenderness   Lungs:  Clear to auscultation bilaterally without wheezes, rales or ronchi; respirations unlabored   Chest Wall:  No tenderness or deformity   Heart:  Regular rate and rhythm, S1 and S2 normal, no murmur, rub or gallop   Breast Exam:  No chest wall tenderness, masses or gynecomastia   Abdomen:  Soft, non-tender, nondistended, normoactive bowel sounds, no masses, no hepatosplenomegaly   Genitalia:  Deferred to urologist  Rectal:  Deferred to urologist  Extremities:  No clubbing, cyanosis or edema   Pulses:  2+ and symmetric all extremities   Skin:  Skin color, texture, turgor normal, no rashes. Hyperkeratotic lesion at right medial ankle, unchanged x years  Lymph nodes:  Cervical, supraclavicular, and axillary nodes normal   Neurologic:  CNII-XII intact, normal strength, sensation and gait; reflexes 2+ and symmetric throughout    Psych: Normal mood, affect, hygiene and grooming   Lab Results  Component Value Date   HGBA1C 5.5 05/26/2017     ASSESSMENT/PLAN:  Medicare annual wellness visit, subsequent  Essential hypertension, benign - Plan: Comprehensive metabolic panel, CBC with Differential/Platelet  IFG (impaired fasting  glucose) - Plan: HgB A1c, Comprehensive metabolic panel  Prostate cancer ([place]) - doing well; f/u as planned with urologist  Medication monitoring encounter - Plan: Comprehensive metabolic panel, CBC with Differential/Platelet  Seasonal allergic rhinitis, unspecified trigger - continue Flonase, reviewed proper technique  A1c and c-met, CBC (no thyroid symptoms) Lipids fine last yer   Recommended at least 30 minutes of aerobic activity at least 5 days/week, weight-bearing exercise at least 2x/week; proper sunscreen  use reviewed; healthy diet and alcohol recommendations (less than or equal to 2 drinks/day) reviewed; regular seatbelt use; changing batteries in smoke detectors. Self-testicular exams. Immunization recommendations discussed--UTD; continue yearly flu shots. Shingrix recommended. Colonoscopy recommendations reviewed--past due.  Patient will call to schedule.  F/u 1 year, sooner prn for concerns, or if BP's are consistently >135/85   Medicare Attestation I have personally reviewed: The patient's medical and social history Their use of alcohol, tobacco or illicit drugs Their current medications and supplements The patient's functional ability including ADLs,fall risks, home safety risks, cognitive, and hearing and visual impairment Diet and physical activities Evidence for depression or mood disorders  The patient's weight, height and BMI have been recorded in the chart.  I have made referrals, counseling, and provided education to the patient based on review of the above and I have provided the patient with a written personalized care plan for preventive services.

## 2017-05-26 ENCOUNTER — Ambulatory Visit (INDEPENDENT_AMBULATORY_CARE_PROVIDER_SITE_OTHER): Payer: Medicare HMO | Admitting: Family Medicine

## 2017-05-26 ENCOUNTER — Encounter: Payer: Self-pay | Admitting: Family Medicine

## 2017-05-26 VITALS — BP 126/80 | HR 92 | Ht 63.0 in | Wt 121.4 lb

## 2017-05-26 DIAGNOSIS — R7301 Impaired fasting glucose: Secondary | ICD-10-CM | POA: Diagnosis not present

## 2017-05-26 DIAGNOSIS — J302 Other seasonal allergic rhinitis: Secondary | ICD-10-CM

## 2017-05-26 DIAGNOSIS — Z5181 Encounter for therapeutic drug level monitoring: Secondary | ICD-10-CM | POA: Diagnosis not present

## 2017-05-26 DIAGNOSIS — C61 Malignant neoplasm of prostate: Secondary | ICD-10-CM

## 2017-05-26 DIAGNOSIS — Z0000 Encounter for general adult medical examination without abnormal findings: Secondary | ICD-10-CM | POA: Diagnosis not present

## 2017-05-26 DIAGNOSIS — I10 Essential (primary) hypertension: Secondary | ICD-10-CM | POA: Diagnosis not present

## 2017-05-26 LAB — POCT GLYCOSYLATED HEMOGLOBIN (HGB A1C): Hemoglobin A1C: 5.5

## 2017-05-26 MED ORDER — LISINOPRIL 10 MG PO TABS: 10.0000 mg | ORAL_TABLET | Freq: Every day | ORAL | 3 refills | Status: DC

## 2017-05-26 NOTE — Patient Instructions (Addendum)
  HEALTH MAINTENANCE RECOMMENDATIONS:  It is recommended that you get at least 30 minutes of aerobic exercise at least 5 days/week (for weight loss, you may need as much as 60-90 minutes). This can be any activity that gets your heart rate up. This can be divided in 10-15 minute intervals if needed, but try and build up your endurance at least once a week.  Weight bearing exercise is also recommended twice weekly.  Eat a healthy diet with lots of vegetables, fruits and fiber.  "Colorful" foods have a lot of vitamins (ie green vegetables, tomatoes, red peppers, etc).  Limit sweet tea, regular sodas and alcoholic beverages, all of which has a lot of calories and sugar.  Up to 2 alcoholic drinks daily may be beneficial for men (unless trying to lose weight, watch sugars).  Drink a lot of water.  Sunscreen of at least SPF 30 should be used on all sun-exposed parts of the skin when outside between the hours of 10 am and 4 pm (not just when at beach or pool, but even with exercise, golf, tennis, and yard work!)  Use a sunscreen that says "broad spectrum" so it covers both UVA and UVB rays, and make sure to reapply every 1-2 hours.  Remember to change the batteries in your smoke detectors when changing your clock times in the spring and fall. I recommend having a carbon monoxide detector in your home.   Use your seat belt every time you are in a car, and please drive safely and not be distracted with cell phones and texting while driving.   Gary Blair , Thank you for taking time to come for your Medicare Wellness Visit. I appreciate your ongoing commitment to your health goals. Please review the following plan we discussed and let me know if I can assist you in the future.   These are the goals we discussed: Goals    None      This is a list of the screening recommended for you and due dates:  Health Maintenance  Topic Date Due  . Flu Shot  11/27/2016  . Colon Cancer Screening  04/27/2017  .  Tetanus Vaccine  05/19/2025  .  Hepatitis C: One time screening is recommended by Center for Disease Control  (CDC) for  adults born from [birth year] through 1967.   Completed  . Pneumonia vaccines  Completed   Continue yearly high dose flu shots. Please call your gastroenterologist to set up your colonoscopy.  I recommend getting the new shingles vaccine (Shingrix). You will need to check with your insurance to see if it is covered, and if covered by Medicare Part D, you need to get from the pharmacy rather than our office.  It is a series of 2 injections, spaced 2 months apart. (since you don't have Medicare part D, you likely will need to pay out of pocket for this vaccine--shop around for the best price, if you want it).  Be sure to point the Flonase spray away from the mid-line.  Use a nasal saline spray frequently throughout the day to help keep the mucosa moist and prevent bleeding.

## 2017-05-27 LAB — COMPREHENSIVE METABOLIC PANEL
A/G RATIO: 2 (ref 1.2–2.2)
ALBUMIN: 4.7 g/dL (ref 3.6–4.8)
ALT: 17 IU/L (ref 0–44)
AST: 18 IU/L (ref 0–40)
Alkaline Phosphatase: 71 IU/L (ref 39–117)
BUN / CREAT RATIO: 11 (ref 10–24)
BUN: 13 mg/dL (ref 8–27)
Bilirubin Total: 0.7 mg/dL (ref 0.0–1.2)
CO2: 18 mmol/L — ABNORMAL LOW (ref 20–29)
Calcium: 9.4 mg/dL (ref 8.6–10.2)
Chloride: 107 mmol/L — ABNORMAL HIGH (ref 96–106)
Creatinine, Ser: 1.21 mg/dL (ref 0.76–1.27)
GFR calc non Af Amer: 61 mL/min/{1.73_m2} (ref >59)
GFR, EST AFRICAN AMERICAN: 71 mL/min/{1.73_m2} (ref >59)
GLOBULIN, TOTAL: 2.4 g/dL (ref 1.5–4.5)
Glucose: 116 mg/dL — ABNORMAL HIGH (ref 65–99)
POTASSIUM: 4.1 mmol/L (ref 3.5–5.2)
SODIUM: 145 mmol/L — AB (ref 134–144)
Total Protein: 7.1 g/dL (ref 6.0–8.5)

## 2017-05-27 LAB — CBC WITH DIFFERENTIAL/PLATELET
Basophils Absolute: 0 10*3/uL (ref 0.0–0.2)
Basos: 1 %
EOS (ABSOLUTE): 0.1 10*3/uL (ref 0.0–0.4)
EOS: 3 %
HEMATOCRIT: 44.9 % (ref 37.5–51.0)
Hemoglobin: 15 g/dL (ref 13.0–17.7)
Immature Grans (Abs): 0 10*3/uL (ref 0.0–0.1)
Immature Granulocytes: 0 %
LYMPHS ABS: 1.7 10*3/uL (ref 0.7–3.1)
Lymphs: 41 %
MCH: 31.6 pg (ref 26.6–33.0)
MCHC: 33.4 g/dL (ref 31.5–35.7)
MCV: 95 fL (ref 79–97)
Monocytes Absolute: 0.5 10*3/uL (ref 0.1–0.9)
Monocytes: 11 %
NEUTROS ABS: 1.9 10*3/uL (ref 1.4–7.0)
NEUTROS PCT: 44 %
PLATELETS: 215 10*3/uL (ref 150–379)
RBC: 4.75 x10E6/uL (ref 4.14–5.80)
RDW: 13.3 % (ref 12.3–15.4)
WBC: 4.2 10*3/uL (ref 3.4–10.8)

## 2017-06-05 ENCOUNTER — Other Ambulatory Visit: Payer: Self-pay | Admitting: Family Medicine

## 2017-06-05 DIAGNOSIS — J3089 Other allergic rhinitis: Secondary | ICD-10-CM

## 2017-07-07 DIAGNOSIS — R69 Illness, unspecified: Secondary | ICD-10-CM | POA: Diagnosis not present

## 2017-07-15 DIAGNOSIS — D122 Benign neoplasm of ascending colon: Secondary | ICD-10-CM | POA: Diagnosis not present

## 2017-07-15 DIAGNOSIS — K5730 Diverticulosis of large intestine without perforation or abscess without bleeding: Secondary | ICD-10-CM | POA: Diagnosis not present

## 2017-07-15 DIAGNOSIS — D123 Benign neoplasm of transverse colon: Secondary | ICD-10-CM | POA: Diagnosis not present

## 2017-07-15 DIAGNOSIS — Z86010 Personal history of colonic polyps: Secondary | ICD-10-CM | POA: Diagnosis not present

## 2017-07-15 LAB — HM COLONOSCOPY

## 2017-08-11 DIAGNOSIS — R69 Illness, unspecified: Secondary | ICD-10-CM | POA: Diagnosis not present

## 2017-10-29 DIAGNOSIS — H5203 Hypermetropia, bilateral: Secondary | ICD-10-CM | POA: Diagnosis not present

## 2017-10-29 DIAGNOSIS — H2513 Age-related nuclear cataract, bilateral: Secondary | ICD-10-CM | POA: Diagnosis not present

## 2018-01-04 DIAGNOSIS — R69 Illness, unspecified: Secondary | ICD-10-CM | POA: Diagnosis not present

## 2018-01-07 DIAGNOSIS — Z8546 Personal history of malignant neoplasm of prostate: Secondary | ICD-10-CM | POA: Diagnosis not present

## 2018-01-07 DIAGNOSIS — C61 Malignant neoplasm of prostate: Secondary | ICD-10-CM | POA: Diagnosis not present

## 2018-04-30 DIAGNOSIS — D2261 Melanocytic nevi of right upper limb, including shoulder: Secondary | ICD-10-CM | POA: Diagnosis not present

## 2018-04-30 DIAGNOSIS — L821 Other seborrheic keratosis: Secondary | ICD-10-CM | POA: Diagnosis not present

## 2018-04-30 DIAGNOSIS — B078 Other viral warts: Secondary | ICD-10-CM | POA: Diagnosis not present

## 2018-04-30 DIAGNOSIS — L814 Other melanin hyperpigmentation: Secondary | ICD-10-CM | POA: Diagnosis not present

## 2018-04-30 DIAGNOSIS — D225 Melanocytic nevi of trunk: Secondary | ICD-10-CM | POA: Diagnosis not present

## 2018-04-30 DIAGNOSIS — D2271 Melanocytic nevi of right lower limb, including hip: Secondary | ICD-10-CM | POA: Diagnosis not present

## 2018-04-30 DIAGNOSIS — Z85828 Personal history of other malignant neoplasm of skin: Secondary | ICD-10-CM | POA: Diagnosis not present

## 2018-04-30 DIAGNOSIS — L570 Actinic keratosis: Secondary | ICD-10-CM | POA: Diagnosis not present

## 2018-04-30 DIAGNOSIS — L723 Sebaceous cyst: Secondary | ICD-10-CM | POA: Diagnosis not present

## 2018-05-26 ENCOUNTER — Encounter: Payer: Self-pay | Admitting: Family Medicine

## 2018-05-26 NOTE — Progress Notes (Signed)
Chief Complaint  Patient presents with  . Medicare Wellness    fasting AWV exam-sees eye doctor yearly. Has been having some "gut" pain off and on over the last few weeks, he thinks he may have have an ulcer-was taking some OTC tagamet (stopped Sat) and not sure if it is helping.     Gary Blair is a 68 y.o. male who presents for annual physical exam, Medicare wellness visit and follow-up on chronic medical conditions.    He has continued postnasal drainage, with sore throat at night. He uses flonase daily at night. He isn't sure if it is helping. He noted some rawness, bleeding and scabbing for a few months.  He stopped the Flonase for a week a couple of weeks ago--the rawness got better, he didn't notice any change in his breathing in that time. He reports trying xyzal and claritin in the past without any noted improvement.  He is complaining of a dull ache in his lower mid chest.  The pain seems to go away for a short bit after eating, similar to his prior ulcer.  He took OTC Tagamet once daily for 3 days, and it seemed to help some.  Hasn't taken in 4 days, and has some discomfort at his visit.  He has distant h/o ulcer, treated with Tagamet in past.  He uses Aleve about once a week for "overall pain", arthritis.  Denies heartburn, reflux (rare, once every 2 months).  Hypertension. He was started on lisinopril in December 2015. BP's have been running mid 120's/70's.  Denies any side effects or cough (just related to the PND, which was prior to starting ACEI).. Denies headaches, dizziness, chest pain, palpitations, edema.   Prostate Cancer: He is s/p brachytherapy 09/2014. He last saw urologist in 12/2017, doing well. He denies any significant urinary complaints.  Impaired fasting glucose: he doesn't check sugars. He tries to limit sugar in his diet (candy, sweets, no regular sodas or sweet tea--very rare). He continues to eat ice cream every night (now using sugar-free), cut back  some on alcohol (average of 1-2 drink per day, every day. rarely drinks more (ballgames, up to 4-5)).   Immunization History  Administered Date(s) Administered  . Influenza Split 03/16/2005, 02/15/2006, 02/01/2007, 02/25/2008, 04/20/2009, 01/28/2012  . Influenza Whole 02/09/2013  . Influenza, High Dose Seasonal PF 01/23/2016, 01/27/2017  . Influenza,trivalent, recombinat, inj, PF 01/08/2014  . Influenza-Unspecified 01/01/2015, 01/04/2018  . Pneumococcal Conjugate-13 04/11/2014  . Pneumococcal Polysaccharide-23 12/14/2004, 04/17/2015  . Td 06/27/1989, 09/30/2001  . Tdap 05/20/2015  . Zoster 04/26/2011   Last colonoscopy: 06/2017 Dr. Erlene Quan [facility]); we did not receive report, but I can see date in Epic. Unfortunately, cannot see the path report--will get. He was told to f/u in 7 years. Last PSA: per urologist, UTD  Dentist: twice yearly  Ophtho: yearly Exercise: unloading trucks, active on job, yardwork.Going to [employer] 3-4x/week since December (cardio x 45 mins, plus weight machines); Previousy reported 1.5-2.5 miles walking at work per his Health app on his phone--he reports work exercise is the same. Lipids: Lab Results  Component Value Date   CHOL 178 04/17/2016   HDL 69 04/17/2016   LDLCALC 97 04/17/2016   TRIG 58 04/17/2016   CHOLHDL 2.6 04/17/2016    Other doctors caring for patient include: Ophtho: Dr. Claudean Kinds (retired) Derm: Dr. Ubaldo Glassing Dentist: Dr.Saraf GI: Dr. Erlene Quan ([facility], in [place]) Urology: Dr. Diona Fanti (Radiation onc: Dr. Valere Dross)  Depression screen: negative Fall screen: negative Functional status screen: unremarkable.  Mini-Cog screen: normal See full screen in Epic.  End of Life Discussion: Patient hasa living will and medical power of attorney(and brought in 03/2015)  Past Medical History:  Diagnosis Date  . Arthritis   . At risk for sleep apnea    STOP-BANG= 4        SENT TO PCP 10-25-2014  . Borderline hyperlipidemia    . Carpal tunnel syndrome on both sides    right > left  . Diverticulosis of colon    moderate  . History of adenomatous polyp of colon    2006,  2010,  tubular adenoma 04-27-2014 due again 03/2017  . History of chronic sinusitis   . History of gastric ulcer    yrs ago  . History of gastroesophageal reflux (GERD)   . Hypertension   . Prostate cancer [facility]) dx 06/2014   urologist-  Dr. Diona Fanti--  Stage T1c,  Gleason (3+3) 6,  vol. 25.59ml  . S/P radiation therapy 10/27/14   Prostate 14,500 cGy implanting 67 I-125 seeds utilizing 31 needles. Individual seed activity 0.363 mCi per seed for a total implant activity of 24.32 mCi.  . Wears glasses     Past Surgical History:  Procedure Laterality Date  . CARPAL TUNNEL RELEASE Right 06/2016   Dr. Burney Gauze  . CERVICAL LAMINECTOMY  1984  . COLONOSCOPY  04-27-2014, 06/2017  . PROSTATE BIOPSY  07/20/14  . RADIOACTIVE SEED IMPLANT N/A 10/27/2014   Procedure: RADIOACTIVE SEED IMPLANT/BRACHYTHERAPY IMPLANT;  Surgeon: Franchot Gallo, MD;  Location: [facility];  Service: Urology;  Laterality: N/A;  67 seeds implanted no seeds found in bladder   . TONSILLECTOMY  child    Social History   Socioeconomic History  . Marital status: Married    Spouse name: Not on file  . Number of children: 1  . Years of education: Not on file  . Highest education level: Not on file  Occupational History  . Occupation: Truck Education administrator: [employer]    Comment: Logistics  Social Needs  . Financial resource strain: Not on file  . Food insecurity:    Worry: Not on file    Inability: Not on file  . Transportation needs:    Medical: Not on file    Non-medical: Not on file  Tobacco Use  . Smoking status: Former Smoker    Packs/day: 0.50    Years: 45.00    Pack years: 22.50    Types: Cigarettes    Last attempt to quit: 03/29/2013    Years since quitting: 5.1  . Smokeless tobacco: Never Used  Substance and Sexual Activity   . Alcohol use: Yes    Alcohol/week: 14.0 standard drinks    Types: 14 Cans of beer per week    Comment: average of 1-2 drink per day, every day.  rarely drinks more (ballgames, up to 4-5)  . Drug use: No  . Sexual activity: Not Currently    Partners: Female    Comment: ED (and wife's issues)  Lifestyle  . Physical activity:    Days per week: Not on file    Minutes per session: Not on file  . Stress: Not on file  Relationships  . Social connections:    Talks on phone: Not on file    Gets together: Not on file    Attends religious service: Not on file    Active member of club or organization: Not on file    Attends meetings of clubs  or organizations: Not on file    Relationship status: Not on file  . Intimate partner violence:    Fear of current or ex partner: Not on file    Emotionally abused: Not on file    Physically abused: Not on file    Forced sexual activity: Not on file  Other Topics Concern  . Not on file  Social History Narrative   Married. No pets.  Daughter lives in [place]    Family History  Problem Relation Age of Onset  . Heart attack Mother        deceased age 27  . Stroke Father        deceased age 70  . Hypertension Father   . Prostate cancer Father        prostate in his late 32's  . Heart attack Brother   . Diabetes Neg Hx     Outpatient Encounter Medications as of 05/27/2018  Medication Sig Note  . aspirin 81 MG tablet Take 81 mg by mouth daily.   . fluticasone (FLONASE) 50 MCG/ACT nasal spray Place 2 sprays into both nostrils daily.   Marland Kitchen lisinopril (PRINIVIL,ZESTRIL) 10 MG tablet Take 1 tablet (10 mg total) by mouth daily.   . Multiple Vitamins-Minerals (MULTIVITAMIN WITH MINERALS) tablet Take 1 tablet by mouth daily.   . Omega-3 Fatty Acids (OMEGA 3 500 PO) Take 1 capsule by mouth daily. 05/26/2017: Unsure of dose  . vitamin C (ASCORBIC ACID) 500 MG tablet Take 500 mg by mouth daily.   . [DISCONTINUED] fluticasone (FLONASE) 50 MCG/ACT nasal  spray USE TWO SPRAY(S) IN EACH NOSTRIL ONCE DAILY   . [DISCONTINUED] lisinopril (PRINIVIL,ZESTRIL) 10 MG tablet Take 1 tablet (10 mg total) by mouth daily.   Marland Kitchen ipratropium (ATROVENT) 0.06 % nasal spray Place 2 sprays into both nostrils 4 (four) times daily as needed for rhinitis.    No facility-administered encounter medications on file as of 05/27/2018.    Allergies  Allergen Reactions  . Augmentin [Amoxicillin-Pot Clavulanate] Swelling  . Ciprofloxacin Hcl Other (See Comments)    Stated reddened skin  . Ibuprofen Itching  . Penicillins     Pruritis, joint pains,throat swelling when he took Augmentin  . Sulfa Antibiotics Rash    ROS: The patient denies fever, chills, Denies headaches, vision loss, ear pain, chest pain, palpitations, dizziness, syncope, dyspnea on exertion, cough(just in mornings from PND), swelling, constipation, abdominal pain, melena, hematochezia, hematuria, genital lesions, weakness, tremor, suspicious skin lesions (sees derm regularly; denies depression, anxiety, abnormal bleeding/bruising, or enlarged lymph nodes.  He has carpal tunnel on the left, tingling at night. Wears brace to sleep. He has some back stiffness in the morning, only rare/minor knee pain.  Has some pain in his wrists, which he says is arthritis.   Chronic PND, unsure if Flonase helps. Some hesitancy and weakened stream, unchanged. No nocturia. +ED 10# weight gain in the last year--He did go up one pant size in waist. Lower chest/upper abdominal pain per HPI   PHYSICAL EXAM:  BP 130/84   Pulse 80   Ht 5\' 3"  (1.6 m)   Wt 131 lb (59.4 kg)   BMI 23.21 kg/m   Wt Readings from Last 3 Encounters:  05/27/18 131 lb (59.4 kg)  05/26/17 121 lb 6.4 oz (55.1 kg)  04/17/16 122 lb 6.4 oz (55.5 kg)    General Appearance:  Alert, cooperative, no distress, appears stated age   Head:  Normocephalic, without obvious abnormality, atraumatic   Eyes:  PERRL,  conjunctiva/corneas clear, EOM's  intact, fundi benign   Ears:  Normal TM's and external ear canals  Nose:  Nares normal, mucosaismildly edematous, small amount of clear mucus on the left and some crusting bilaterally; no sinus tenderness.  Septum deviated to the right.  Throat:  Lips, mucosa, and tongue normal; teeth and gums normal. OP is without erythema, exudates, lesions  Neck:  Supple, no lymphadenopathy; thyroid: no enlargement/tenderness/nodules; no carotid bruit or JVD.   Back:  Spine nontender, no curvature, ROM normal, no CVA tenderness   Lungs:  Clear to auscultation bilaterally without wheezes, rales or ronchi; respirations unlabored   Chest Wall:  No tenderness or deformity   Heart:  Regularrate andrhythm, S1 and S2 normal, no murmur, rub or gallop   Breast Exam:  No chest wall tenderness, masses or gynecomastia   Abdomen:  Soft, non-tender, nondistended, normoactive bowel sounds, no masses, no hepatosplenomegaly   Genitalia:  No lesions. Testicles descended bilaterally without masses. No hernias.  Rectal:  Deferred to urologist  Extremities:  No clubbing, cyanosis or edema   Pulses:  2+ and symmetric all extremities   Skin:  Skin color, texture, turgor normal, no rashes.  Lymph nodes:  Cervical, supraclavicular, and axillary nodes normal   Neurologic:  CNII-XII intact, normal strength, sensation and gait; reflexes 2+ and symmetric throughout    Psych: Normal mood, affect, hygiene and grooming   ASSESSMENT/PLAN:  Annual physical exam - Plan: POCT Urinalysis DIP (Proadvantage Device), Comprehensive metabolic panel, CBC with Differential/Platelet, TSH, Lipid panel, Hemoglobin A1c  Medicare annual wellness visit, subsequent  Essential hypertension, benign - Plan: Comprehensive metabolic panel, Lipid panel, lisinopril (PRINIVIL,ZESTRIL) 10 MG tablet  IFG (impaired fasting glucose) - Plan: Comprehensive metabolic panel,  Hemoglobin A1c  Prostate cancer (HCC)  Seasonal allergic rhinitis, unspecified trigger  Medication monitoring encounter - Plan: Comprehensive metabolic panel, CBC with Differential/Platelet  Weight gain - Plan: TSH  Chronic non-seasonal allergic rhinitis - continue flonase, reviewed proper technique. Trial atrovent spray - Plan: fluticasone (FLONASE) 50 MCG/ACT nasal spray, ipratropium (ATROVENT) 0.06 % nasal spray   Pt to sign ROR in order to get colonoscopy report and pathology from colonoscopy 06/2017, Dr. Erlene Quan, [facility]  Recommended at least 30 minutes of aerobic activity at least 5 days/week, weight-bearing exercise at least 2x/week; proper sunscreen use reviewed; healthy diet and alcohol recommendations (less than or equal to 2 drinks/day) reviewed; regular seatbelt use; changing batteries in smoke detectors. Self-testicular exams. Immunization recommendations discussed--UTD; continue yearly flu shots. Shingrix recommended.Colonoscopy recommendations reviewed, UTD.  Will get results, including pathology.  MOST form completed (unable to locate chart for prior MOST form until after new one was completed). Full Code, Full Care.  F/u 1 year, sooner prn for concerns, or if BP's are consistently >135/85  Be sure to aim your Flonase AWAY from the septum, more towards the outside, and use saline spray very frequently.    Avoid aleve and other anti-inflammatories which can inflame the stomach--use Tylenol Arthritis instead for any pain.  Try the atrovent nasal spray to see if this helps decrease your postnasal drainage.  If it helps a lot, you can try cutting back on the Flonase to see if it is still needed or not. Your stomach exam was normal--I do not suspect an ulcer, but you might have inflammation of your esophagus.  Avoid anti-inflammatories (use tylenol instead of aleve). Try and cut back on alcohol. Take Prilosec OTC 20mg  once daily for 2 week (or Nexium OTC). If you still have  discomfort after 2 weeks, and especially if you still have bloating or other discomfort in your abdomen, please contact your GI (at [facility], whoever replaced Dr. Erlene Quan).    Medicare Attestation I have personally reviewed: The patient's medical and social history Their use of alcohol, tobacco or illicit drugs Their current medications and supplements The patient's functional ability including ADLs,fall risks, home safety risks, cognitive, and hearing and visual impairment Diet and physical activities Evidence for depression or mood disorders  The patient's weight, height and BMI have been recorded in the chart.  I have made referrals, counseling, and provided education to the patient based on review of the above and I have provided the patient with a written personalized care plan for preventive services.

## 2018-05-26 NOTE — Patient Instructions (Addendum)
HEALTH MAINTENANCE RECOMMENDATIONS:  It is recommended that you get at least 30 minutes of aerobic exercise at least 5 days/week (for weight loss, you may need as much as 60-90 minutes). This can be any activity that gets your heart rate up. This can be divided in 10-15 minute intervals if needed, but try and build up your endurance at least once a week.  Weight bearing exercise is also recommended twice weekly.  Eat a healthy diet with lots of vegetables, fruits and fiber.  "Colorful" foods have a lot of vitamins (ie green vegetables, tomatoes, red peppers, etc).  Limit sweet tea, regular sodas and alcoholic beverages, all of which has a lot of calories and sugar.  Up to 2 alcoholic drinks daily may be beneficial for men (unless trying to lose weight, watch sugars).  Drink a lot of water.  Sunscreen of at least SPF 30 should be used on all sun-exposed parts of the skin when outside between the hours of 10 am and 4 pm (not just when at beach or pool, but even with exercise, golf, tennis, and yard work!)  Use a sunscreen that says "broad spectrum" so it covers both UVA and UVB rays, and make sure to reapply every 1-2 hours.  Remember to change the batteries in your smoke detectors when changing your clock times in the spring and fall.  Use your seat belt every time you are in a car, and please drive safely and not be distracted with cell phones and texting while driving.    Gary Blair , Thank you for taking time to come for your Medicare Wellness Visit. I appreciate your ongoing commitment to your health goals. Please review the following plan we discussed and let me know if I can assist you in the future.    This is a list of the screening recommended for you and due dates:  Health Maintenance  Topic Date Due  . Colon Cancer Screening  04/27/2017  . Tetanus Vaccine  05/19/2025  . Flu Shot  Completed  .  Hepatitis C: One time screening is recommended by Center for Disease Control  (CDC) for   adults born from [birth year] through 1967.   Completed  . Pneumonia vaccines  Completed   Ignore the date above for colon cancer screening--I know you had it 06/2017, we just haven't received the results to update your chart. We will call to get the results, including the pathology report.  I recommend getting the new shingles vaccine (Shingrix). It should be covered by your Medicare Part D, so you need to get from the pharmacy rather than our office.  It is a series of 2 injections, spaced 2 months apart.  Be sure to aim your Flonase AWAY from the septum, more towards the outside, and use saline spray very frequently.    Avoid aleve and other anti-inflammatories which can inflame the stomach--use Tylenol Arthritis instead for any pain.  Try the atrovent nasal spray to see if this helps decrease your postnasal drainage.  If it helps a lot, you can try cutting back on the Flonase to see if it is still needed or not. Your stomach exam was normal--I do not suspect an ulcer, but you might have inflammation of your esophagus.  Avoid anti-inflammatories (use tylenol instead of aleve). Try and cut back on alcohol. Take Prilosec OTC 20mg  once daily for 2 week (or Nexium OTC). If you still have discomfort after 2 weeks, and especially if you still have bloating or other  discomfort in your abdomen, please contact your GI (at [facility], whoever replaced Dr. Erlene Quan).

## 2018-05-27 ENCOUNTER — Encounter: Payer: Self-pay | Admitting: Family Medicine

## 2018-05-27 ENCOUNTER — Ambulatory Visit (INDEPENDENT_AMBULATORY_CARE_PROVIDER_SITE_OTHER): Payer: Medicare HMO | Admitting: Family Medicine

## 2018-05-27 VITALS — BP 130/84 | HR 80 | Ht 63.0 in | Wt 131.0 lb

## 2018-05-27 DIAGNOSIS — J3089 Other allergic rhinitis: Secondary | ICD-10-CM | POA: Diagnosis not present

## 2018-05-27 DIAGNOSIS — Z5181 Encounter for therapeutic drug level monitoring: Secondary | ICD-10-CM | POA: Diagnosis not present

## 2018-05-27 DIAGNOSIS — Z0000 Encounter for general adult medical examination without abnormal findings: Secondary | ICD-10-CM

## 2018-05-27 DIAGNOSIS — R7301 Impaired fasting glucose: Secondary | ICD-10-CM | POA: Diagnosis not present

## 2018-05-27 DIAGNOSIS — I10 Essential (primary) hypertension: Secondary | ICD-10-CM | POA: Diagnosis not present

## 2018-05-27 DIAGNOSIS — R635 Abnormal weight gain: Secondary | ICD-10-CM | POA: Diagnosis not present

## 2018-05-27 DIAGNOSIS — J302 Other seasonal allergic rhinitis: Secondary | ICD-10-CM | POA: Diagnosis not present

## 2018-05-27 DIAGNOSIS — C61 Malignant neoplasm of prostate: Secondary | ICD-10-CM | POA: Diagnosis not present

## 2018-05-27 LAB — POCT URINALYSIS DIP (PROADVANTAGE DEVICE)
Bilirubin, UA: NEGATIVE
Blood, UA: NEGATIVE
Glucose, UA: NEGATIVE mg/dL
Ketones, POC UA: NEGATIVE mg/dL
LEUKOCYTES UA: NEGATIVE
NITRITE UA: NEGATIVE
PROTEIN UA: NEGATIVE mg/dL
Specific Gravity, Urine: 1.01
Urobilinogen, Ur: NEGATIVE
pH, UA: 7 (ref 5.0–8.0)

## 2018-05-27 MED ORDER — FLUTICASONE PROPIONATE 50 MCG/ACT NA SUSP: 2.0000 | Freq: Every day | NASAL | 3 refills | Status: DC

## 2018-05-27 MED ORDER — LISINOPRIL 10 MG PO TABS: 10.0000 mg | ORAL_TABLET | Freq: Every day | ORAL | 3 refills | Status: DC

## 2018-05-27 MED ORDER — IPRATROPIUM BROMIDE 0.06 % NA SOLN: 2.0000 | Freq: Four times a day (QID) | NASAL | 5 refills | Status: DC | PRN

## 2018-05-28 LAB — CBC WITH DIFFERENTIAL/PLATELET
BASOS ABS: 0 10*3/uL (ref 0.0–0.2)
Basos: 1 %
EOS (ABSOLUTE): 0.2 10*3/uL (ref 0.0–0.4)
EOS: 5 %
HEMATOCRIT: 44 % (ref 37.5–51.0)
HEMOGLOBIN: 15.3 g/dL (ref 13.0–17.7)
IMMATURE GRANS (ABS): 0 10*3/uL (ref 0.0–0.1)
IMMATURE GRANULOCYTES: 0 %
LYMPHS ABS: 1.6 10*3/uL (ref 0.7–3.1)
LYMPHS: 35 %
MCH: 31.7 pg (ref 26.6–33.0)
MCHC: 34.8 g/dL (ref 31.5–35.7)
MCV: 91 fL (ref 79–97)
MONOCYTES: 14 %
Monocytes Absolute: 0.6 10*3/uL (ref 0.1–0.9)
Neutrophils Absolute: 2 10*3/uL (ref 1.4–7.0)
Neutrophils: 45 %
Platelets: 247 10*3/uL (ref 150–450)
RBC: 4.82 x10E6/uL (ref 4.14–5.80)
RDW: 11.9 % (ref 11.6–15.4)
WBC: 4.5 10*3/uL (ref 3.4–10.8)

## 2018-05-28 LAB — COMPREHENSIVE METABOLIC PANEL
ALBUMIN: 4.7 g/dL (ref 3.8–4.8)
ALK PHOS: 78 IU/L (ref 39–117)
ALT: 21 IU/L (ref 0–44)
AST: 19 IU/L (ref 0–40)
Albumin/Globulin Ratio: 2 (ref 1.2–2.2)
BILIRUBIN TOTAL: 0.8 mg/dL (ref 0.0–1.2)
BUN / CREAT RATIO: 14 (ref 10–24)
BUN: 15 mg/dL (ref 8–27)
CO2: 21 mmol/L (ref 20–29)
CREATININE: 1.05 mg/dL (ref 0.76–1.27)
Calcium: 9.4 mg/dL (ref 8.6–10.2)
Chloride: 102 mmol/L (ref 96–106)
GFR calc Af Amer: 83 mL/min/{1.73_m2} (ref >59)
GFR calc non Af Amer: 72 mL/min/{1.73_m2} (ref >59)
GLUCOSE: 104 mg/dL — AB (ref 65–99)
Globulin, Total: 2.3 g/dL (ref 1.5–4.5)
Potassium: 4.4 mmol/L (ref 3.5–5.2)
Sodium: 139 mmol/L (ref 134–144)
TOTAL PROTEIN: 7 g/dL (ref 6.0–8.5)

## 2018-05-28 LAB — LIPID PANEL
CHOLESTEROL TOTAL: 231 mg/dL — AB (ref 100–199)
Chol/HDL Ratio: 3 ratio (ref 0.0–5.0)
HDL: 78 mg/dL (ref >39)
LDL Calculated: 144 mg/dL — ABNORMAL HIGH (ref 0–99)
TRIGLYCERIDES: 47 mg/dL (ref 0–149)
VLDL CHOLESTEROL CAL: 9 mg/dL (ref 5–40)

## 2018-05-28 LAB — HEMOGLOBIN A1C
ESTIMATED AVERAGE GLUCOSE: 117 mg/dL
Hgb A1c MFr Bld: 5.7 % — ABNORMAL HIGH (ref 4.8–5.6)

## 2018-05-28 LAB — TSH: TSH: 1.6 u[IU]/mL (ref 0.450–4.500)

## 2018-06-29 ENCOUNTER — Telehealth: Payer: Self-pay | Admitting: Family Medicine

## 2018-06-29 NOTE — Telephone Encounter (Signed)
2020 optum forms received and pt has already had [place] on 05/27/2018. Sending back to be completed. Please return to [place].

## 2018-07-01 ENCOUNTER — Telehealth: Payer: Self-pay | Admitting: Family Medicine

## 2018-07-01 NOTE — Telephone Encounter (Signed)
Received requested records from [facility]. Sending back for review.

## 2018-10-08 DIAGNOSIS — R69 Illness, unspecified: Secondary | ICD-10-CM | POA: Diagnosis not present

## 2018-12-20 DIAGNOSIS — R69 Illness, unspecified: Secondary | ICD-10-CM | POA: Diagnosis not present

## 2019-01-08 DIAGNOSIS — R300 Dysuria: Secondary | ICD-10-CM | POA: Diagnosis not present

## 2019-01-08 DIAGNOSIS — Z8546 Personal history of malignant neoplasm of prostate: Secondary | ICD-10-CM | POA: Diagnosis not present

## 2019-01-08 DIAGNOSIS — N5201 Erectile dysfunction due to arterial insufficiency: Secondary | ICD-10-CM | POA: Diagnosis not present

## 2019-04-15 DIAGNOSIS — R69 Illness, unspecified: Secondary | ICD-10-CM | POA: Diagnosis not present

## 2019-04-16 DIAGNOSIS — H52203 Unspecified astigmatism, bilateral: Secondary | ICD-10-CM | POA: Diagnosis not present

## 2019-04-16 DIAGNOSIS — H524 Presbyopia: Secondary | ICD-10-CM | POA: Diagnosis not present

## 2019-04-16 DIAGNOSIS — H43813 Vitreous degeneration, bilateral: Secondary | ICD-10-CM | POA: Diagnosis not present

## 2019-04-16 DIAGNOSIS — H2513 Age-related nuclear cataract, bilateral: Secondary | ICD-10-CM | POA: Diagnosis not present

## 2019-04-19 DIAGNOSIS — Z0100 Encounter for examination of eyes and vision without abnormal findings: Secondary | ICD-10-CM | POA: Diagnosis not present

## 2019-05-28 ENCOUNTER — Ambulatory Visit: Payer: Medicare HMO

## 2019-05-29 NOTE — Progress Notes (Signed)
Chief Complaint  Patient presents with  . Medicare Wellness    fasting AWV. Has been having some lower abd pain since Nov and also had some blood in semen with ejaculation.     Gary Blair is a 69 y.o. male who presents for annual physical exam, Medicare wellness visit and follow-up on chronic medical conditions.  He has the following concerns:  He has noticed a "bump" near his rectum over the past 1-1.5 months. Seems like a pimple, not a hemorrhoid  When he wipes, occasionally he will see some blood, never any drainage such as pus. He has h/o peri-anal abscess.  Lower abdominal pain since November, sometimes on the right, or left, or central, described as a dull ache. Having a bowel movement might alleviate some of the ache, but not entirely.  Pain comes/goes, and isn't every day, sometimes can go 3-4 days without symptoms.  He first thought it was muscular, that he pulled something at the gym.  When it came back after NOT using those muscles at the gym, he got concerned it could be something else.  Had a "wet dream"--noted blood in the sperm in November.  He has done some testing since then, and hasn't seen any further blood.  Last year he reported having a lot of post-nasal drainage.  We prescribed a trial of atrovent nasal spray, to use in addition to Flonase. He didn't find it very helpful.  He continues to use Flonase, and finds it works well. (Previously had issues with overuse of neosinephrine). He uses antihistamines only with "hay fever" in the spring.   Last year he had some dull ache in lower mid-chest, temporarily relieved by eating.  He was advised to do a 2 week trial of PPI and to cut back on NSAID intake.  He did Prilosec for a couple of weeks and symptoms resolved. No longer using NSAIDs, and no recurrence of symptoms. Only rare reflux (every couple of months).  Hypertension. He is compliant with taking lisinopril daily and denies side effects.  Some cough related to PND  (which was present prior to starting the aCEI), and sometimes related to what he eats (ie popcorn).  He no longer checks BP (doesn't have his own monitor, no access since COVID at pharmacy). He recalls it was 130/70 or 80 at the urologist in September.  Denies headaches, dizziness, chest pain, palpitations, edema.  Has had some increased in sodium in diet, related to soups.  Hypercholesterolemia:  LDL went up from 97 to 144 last year.  Due for recheck.  He has tried to cut back on cholesterol in his diet. Red meat once or twice a week, 2 eggs/week. +pimiento cheese.  Rarely has creamy soups/sauces/dressings.  Prostate Cancer: He is s/p brachytherapy 09/2014. He last saw urologist in 12/2018, doing well.He denies any significant urinary complaints (slight hesitancy and weakened stream, unchanged)  Impaired fasting glucose: he doesn't check sugars. He tries to limit sugar in his diet (candy, sweets, no regular sodas or sweet tea--very rare).  He continues to eat sugar free ice cream frequently, cut back some on alcohol (average of 1 drink per day now, total of 10-12/week, more if doing yardwork, or watching a game).  Lab Results  Component Value Date   HGBA1C 5.7 (H) 05/27/2018     Immunization History  Administered Date(s) Administered  . Influenza Split 03/16/2005, 02/15/2006, 02/01/2007, 02/25/2008, 04/20/2009, 01/28/2012  . Influenza Whole 02/09/2013  . Influenza, High Dose Seasonal PF 01/23/2016, 01/27/2017, 01/04/2018,  12/20/2018  . Influenza,trivalent, recombinat, inj, PF 01/08/2014  . Influenza-Unspecified 01/01/2015, 01/04/2018  . Pneumococcal Conjugate-13 04/11/2014  . Pneumococcal Polysaccharide-23 12/14/2004, 04/17/2015  . Td 06/27/1989, 09/30/2001  . Tdap 05/20/2015  . Zoster 04/26/2011   Last colonoscopy: 06/2017 Dr. Erlene Quan [facility]); we did not receive final report (only "technical component").  He was told to f/u in 5 years. Last PSA: per urologist, UTD Dentist:  twice yearly  Ophtho: yearly Exercise:  Going to [employer] 5-6x/week (cardio x 30-50 mins, plus weight machines) Lipids: Lab Results  Component Value Date   CHOL 231 (H) 05/27/2018   HDL 78 05/27/2018   LDLCALC 144 (H) 05/27/2018   TRIG 47 05/27/2018   CHOLHDL 3.0 05/27/2018    Other doctors caring for patient include: Ophtho: Dr. Satira Sark Derm: Dr. Ubaldo Glassing Dentist: Dr.Saraf GI: Dr. Erlene Quan ([facility], in [place], retired) Urology: Dr. Diona Fanti (Radiation onc: Dr. Valere Dross)  Depression screen: negative Fall screen: negative Functional status screen: unremarkable. Mini-Cog screen: normal See full screen in Epic.  End of Life Discussion: Patient hasa living will and medical power of attorney(and brought in 03/2015)   PMH, PSH, SH and FH were reviewed and updated  Outpatient Encounter Medications as of 05/31/2019  Medication Sig Note  . aspirin 81 MG tablet Take 81 mg by mouth daily.   . fluticasone (FLONASE) 50 MCG/ACT nasal spray Place 2 sprays into both nostrils daily.   Marland Kitchen lisinopril (PRINIVIL,ZESTRIL) 10 MG tablet Take 1 tablet (10 mg total) by mouth daily.   . Multiple Vitamins-Minerals (MULTIVITAMIN WITH MINERALS) tablet Take 1 tablet by mouth daily.   . Omega-3 Fatty Acids (OMEGA 3 500 PO) Take 1 capsule by mouth daily. 05/26/2017: Unsure of dose  . vitamin C (ASCORBIC ACID) 500 MG tablet Take 500 mg by mouth daily.   . [DISCONTINUED] ipratropium (ATROVENT) 0.06 % nasal spray Place 2 sprays into both nostrils 4 (four) times daily as needed for rhinitis.    No facility-administered encounter medications on file as of 05/31/2019.   Allergies  Allergen Reactions  . Augmentin [Amoxicillin-Pot Clavulanate] Swelling  . Ciprofloxacin Hcl Other (See Comments)    Stated reddened skin  . Ibuprofen Itching  . Penicillins     Pruritis, joint pains,throat swelling when he took Augmentin  . Sulfa Antibiotics Rash    ROS: The patient denies fever, chills, Denies  headaches, vision loss, ear pain, chest pain, palpitations, dizziness, syncope, dyspnea on exertion, cough(just in mornings from PND), swelling, constipation, abdominal pain, melena, hematochezia, hematuria, genital lesions, weakness, tremor, suspicious skin lesions(sees derm regularly; denies depression, anxiety, abnormal bleeding/bruising (some bruising related to aspirin)., or enlarged lymph nodes. He has carpal tunnel on the left, tingling at night. Wears brace to sleep most nights. He has some back stiffness in the morning, only rare/minor knee pain.  Has some pain in his wrists, which he says is arthritis.   Chronic PND, controlled with Flonase Some hesitancy and weakened stream, unchanged. No nocturia or incontinence. +ED Rare heartburn. Lower abdominal pain, intermittent, per HPI. Hematospermia per HPI    PHYSICAL EXAM:  BP (!) 150/80   Pulse 80   Temp (!) 97.1 F (36.2 C) (Other (Comment))   Ht 5\' 2"  (1.575 m)   Wt 122 lb 3.2 oz (55.4 kg)   BMI 22.35 kg/m    144/66 on repeat by MD  Wt Readings from Last 3 Encounters:  05/31/19 122 lb 3.2 oz (55.4 kg)  05/27/18 131 lb (59.4 kg)  05/26/17 121 lb 6.4 oz (55.1  kg)    General Appearance:  Alert, cooperative, no distress, appears stated age   Head:  Normocephalic, without obvious abnormality, atraumatic   Eyes:  PERRL, conjunctiva/corneas clear, EOM's intact, fundi benign   Ears:  Normal TM's and external ear canals  Nose:  Not examined, wearing mask due to COVID-19 pandemic  Throat:  Not examined, wearing mask due to COVID-19 pandemic  Neck:  Supple, no lymphadenopathy; thyroid: no enlargement/tenderness/nodules; no carotid bruit or JVD.   Back:  Spine nontender, no curvature, ROM normal, no CVA tenderness   Lungs:  Clear to auscultation bilaterally without wheezes, rales or ronchi; respirations unlabored   Chest Wall:  No tenderness or deformity   Heart:  Regularrate andrhythm,  S1 and S2 normal, no murmur, rub or gallop   Breast Exam:  No chest wall tenderness, masses or gynecomastia   Abdomen:  Soft, non-tender, nondistended, normoactive bowel sounds, no masses, no hepatosplenomegaly. No reproducible tenderness  Genitalia:  No lesions. Testicles descended bilaterally without masses. No hernias.  Rectal:  Normal sphincter tone, no mass.  Prostate is very small, nontender, no nodules or masses.  Small non-inflamed hemorrhoidal tag.  Area of his concern appears more like a hole, poss scar from prior abscess. No active infection or pustule  Extremities:  No clubbing, cyanosis or edema   Pulses:  2+ and symmetric all extremities   Skin:  Skin color, texture, turgor normal, no rashes. Some small ecchymosis/purpua on his left hand and forearm.  Small abrasion above R eyebrow and on bridge of nose (from recent accident at gym)  Lymph nodes:  Cervical, supraclavicular, and axillary nodes normal   Neurologic:  Normal strength, sensation and gait; reflexes 2+ and symmetric throughout    Psych: Normal mood, affect, hygiene and grooming   ASSESSMENT/PLAN:  Annual physical exam - Plan: Comprehensive metabolic panel, Lipid panel, CBC with Differential/Platelet  Medicare annual wellness visit, subsequent  Essential hypertension, benign - elevated today, poss related to increased Na intake.  Low Na diet reviewed, try and monitor elsewhere. f/u if persistently elevated - Plan: Comprehensive metabolic panel, lisinopril (ZESTRIL) 10 MG tablet  IFG (impaired fasting glucose) - improved. Reviewed proper diet. cont regular exercise - Plan: HgB A1c, Comprehensive metabolic panel  Prostate cancer ([facility]) - s/p seed implant.  Doing well, under care of urologist  Chronic non-seasonal allergic rhinitis  Medication monitoring encounter  Pure hypercholesterolemia - diet reviewed in detail - Plan: Lipid panel  Hematospermia - once,  not recurrent.  To f/u with urologist if recurs - Plan: CBC with Differential/Platelet  Abdominal pain, unspecified abdominal location - benign exam; may need CT if persistent/worsening.   - Plan: POCT Urinalysis DIP (Proadvantage Device)  Senile purpura (HCC) - mild, likely contributed by aspirin use - Plan: CBC with Differential/Platelet  Vaccine counseling - counseled in detail about COVID and Shingrix vaccines. Encouraged to get both and discussed the timing   Recommended at least 30 minutes of aerobic activity at least 5 days/week, weight-bearing exercise at least 2x/week; proper sunscreen use reviewed; healthy diet and alcohol recommendations (less than or equal to 2 drinks/day) reviewed; regular seatbelt use; changing batteries in smoke detectors. Self-testicular exams. Immunization recommendations discussed--continue yearly flu shots. Shingrix and COVID vaccines recommended; risks/SE and timing reviewed.Colonoscopy recommendations reviewed, UTD. Did not get final path report from 2019 colonoscopy. Pt just rechecked records and saw 5 year f/u recommended.   MOST form reviewed and signed. Full Code, Full Care.  6 months med check (due to BP being  above goal today, to f/u sooner if BP's remain elevated if/when checked elsewhere).  F/u 1 year for CPE/AWV   Medicare Attestation I have personally reviewed: The patient's medical and social history Their use of alcohol, tobacco or illicit drugs Their current medications and supplements The patient's functional ability including ADLs,fall risks, home safety risks, cognitive, and hearing and visual impairment Diet and physical activities Evidence for depression or mood disorders  The patient's weight, height, BMI have been recorded in the chart.  I have made referrals, counseling, and provided education to the patient based on review of the above and I have provided the patient with a written personalized care plan for preventive  services.

## 2019-05-29 NOTE — Patient Instructions (Addendum)
HEALTH MAINTENANCE RECOMMENDATIONS:  It is recommended that you get at least 30 minutes of aerobic exercise at least 5 days/week (for weight loss, you may need as much as 60-90 minutes). This can be any activity that gets your heart rate up. This can be divided in 10-15 minute intervals if needed, but try and build up your endurance at least once a week.  Weight bearing exercise is also recommended twice weekly.  Eat a healthy diet with lots of vegetables, fruits and fiber.  "Colorful" foods have a lot of vitamins (ie green vegetables, tomatoes, red peppers, etc).  Limit sweet tea, regular sodas and alcoholic beverages, all of which has a lot of calories and sugar.  Up to 2 alcoholic drinks daily may be beneficial for men (unless trying to lose weight, watch sugars).  Drink a lot of water.  Sunscreen of at least SPF 30 should be used on all sun-exposed parts of the skin when outside between the hours of 10 am and 4 pm (not just when at beach or pool, but even with exercise, golf, tennis, and yard work!)  Use a sunscreen that says "broad spectrum" so it covers both UVA and UVB rays, and make sure to reapply every 1-2 hours.  Remember to change the batteries in your smoke detectors when changing your clock times in the spring and fall. Carbon monoxide detectors are recommended for your home.  Use your seat belt every time you are in a car, and please drive safely and not be distracted with cell phones and texting while driving.    [name] , Thank you for taking time to come for your Medicare Wellness Visit. I appreciate your ongoing commitment to your health goals. Please review the following plan we discussed and let me know if I can assist you in the future.    This is a list of the screening recommended for you and due dates:  Health Maintenance  Topic Date Due  . Colon Cancer Screening  07/15/2020  . Tetanus Vaccine  05/19/2025  . Flu Shot  Completed  .  Hepatitis C: One time screening  is recommended by Center for Disease Control  (CDC) for  adults born from [birth year] through 1967.   Completed  . Pneumonia vaccines  Completed   I believe your colon cancer screening date above is INCORRECT.  If you were told 5 years from last colonoscopy, that would be due 2024 (not 2022).  I recommend getting the new shingles vaccine (Shingrix). You will need to get this from the pharmacy rather than our office.  It is a series of 2 injections, spaced 2 months apart. I recommend getting this AFTER you complete the COVID vaccine (at least 4 weeks later).  COVID vaccine is recommended.  New supply is coming into [place] through the health department and [facility].  You can find info through [employer] website, healthy[place].[email or web address] and [facility]. COVID-19 Vaccine Information can be found at: ShippingScam.co.uk For questions related to vaccine distribution or appointments, please email [email or web address] or call [phone].     DASH Eating Plan DASH stands for "Dietary Approaches to Stop Hypertension." The DASH eating plan is a healthy eating plan that has been shown to reduce high blood pressure (hypertension). It may also reduce your risk for type 2 diabetes, heart disease, and stroke. The DASH eating plan may also help with weight loss. What are tips for following this plan?  General guidelines  Avoid eating more than 2,300 mg (milligrams) of salt (sodium)  a day. If you have hypertension, you may need to reduce your sodium intake to 1,500 mg a day.  Limit alcohol intake to no more than 1 drink a day for nonpregnant women and 2 drinks a day for men. One drink equals 12 oz of beer, 5 oz of wine, or 1 oz of hard liquor.  Work with your health care provider to maintain a healthy body weight or to lose weight. Ask what an ideal weight is for you.  Get at least 30 minutes of exercise that causes your heart to beat faster (aerobic exercise) most days  of the week. Activities may include walking, swimming, or biking.  Work with your health care provider or diet and nutrition specialist (dietitian) to adjust your eating plan to your individual calorie needs. Reading food labels   Check food labels for the amount of sodium per serving. Choose foods with less than 5 percent of the Daily Value of sodium. Generally, foods with less than 300 mg of sodium per serving fit into this eating plan.  To find whole grains, look for the word "whole" as the first word in the ingredient list. Shopping  Buy products labeled as "low-sodium" or "no salt added."  Buy fresh foods. Avoid canned foods and premade or frozen meals. Cooking  Avoid adding salt when cooking. Use salt-free seasonings or herbs instead of table salt or sea salt. Check with your health care provider or pharmacist before using salt substitutes.  Do not fry foods. Cook foods using healthy methods such as baking, boiling, grilling, and broiling instead.  Cook with heart-healthy oils, such as olive, canola, soybean, or sunflower oil. Meal planning  Eat a balanced diet that includes: ? 5 or more servings of fruits and vegetables each day. At each meal, try to fill half of your plate with fruits and vegetables. ? Up to 6-8 servings of whole grains each day. ? Less than 6 oz of lean meat, poultry, or fish each day. A 3-oz serving of meat is about the same size as a deck of cards. One egg equals 1 oz. ? 2 servings of low-fat dairy each day. ? A serving of nuts, seeds, or beans 5 times each week. ? Heart-healthy fats. Healthy fats called Omega-3 fatty acids are found in foods such as flaxseeds and coldwater fish, like sardines, salmon, and mackerel.  Limit how much you eat of the following: ? Canned or prepackaged foods. ? Food that is high in trans fat, such as fried foods. ? Food that is high in saturated fat, such as fatty meat. ? Sweets, desserts, sugary drinks, and other foods with  added sugar. ? Full-fat dairy products.  Do not salt foods before eating.  Try to eat at least 2 vegetarian meals each week.  Eat more home-cooked food and less restaurant, buffet, and fast food.  When eating at a restaurant, ask that your food be prepared with less salt or no salt, if possible. What foods are recommended? The items listed may not be a complete list. Talk with your dietitian about what dietary choices are best for you. Grains Whole-grain or whole-wheat bread. Whole-grain or whole-wheat pasta. Brown rice. [place]. Bulgur. Whole-grain and low-sodium cereals. Pita bread. Low-fat, low-sodium crackers. Whole-wheat flour tortillas. Vegetables Fresh or frozen vegetables (raw, steamed, roasted, or grilled). Low-sodium or reduced-sodium tomato and vegetable juice. Low-sodium or reduced-sodium tomato sauce and tomato paste. Low-sodium or reduced-sodium canned vegetables. Fruits All fresh, dried, or frozen fruit. Canned fruit in natural  juice (without added sugar). Meat and other protein foods Skinless chicken or [place]. Ground chicken or [place]. Pork with fat trimmed off. Fish and seafood. Egg whites. Dried beans, peas, or lentils. Unsalted nuts, nut butters, and seeds. Unsalted canned beans. Lean cuts of beef with fat trimmed off. Low-sodium, lean deli meat. Dairy Low-fat (1%) or fat-free (skim) milk. Fat-free, low-fat, or reduced-fat cheeses. Nonfat, low-sodium ricotta or cottage cheese. Low-fat or nonfat yogurt. Low-fat, low-sodium cheese. Fats and oils Soft margarine without trans fats. Vegetable oil. Low-fat, reduced-fat, or light mayonnaise and salad dressings (reduced-sodium). Canola, safflower, olive, soybean, and sunflower oils. Avocado. Seasoning and other foods Herbs. Spices. Seasoning mixes without salt. Unsalted popcorn and pretzels. Fat-free sweets. What foods are not recommended? The items listed may not be a complete list. Talk with your dietitian about what  dietary choices are best for you. Grains Baked goods made with fat, such as croissants, muffins, or some breads. Dry pasta or rice meal packs. Vegetables Creamed or fried vegetables. Vegetables in a cheese sauce. Regular canned vegetables (not low-sodium or reduced-sodium). Regular canned tomato sauce and paste (not low-sodium or reduced-sodium). Regular tomato and vegetable juice (not low-sodium or reduced-sodium). Angie Fava. Olives. Fruits Canned fruit in a light or heavy syrup. Fried fruit. Fruit in cream or butter sauce. Meat and other protein foods Fatty cuts of meat. Ribs. Fried meat. Berniece Salines. Sausage. Bologna and other processed lunch meats. Salami. Fatback. Hotdogs. Bratwurst. Salted nuts and seeds. Canned beans with added salt. Canned or smoked fish. Whole eggs or egg yolks. Chicken or [place] with skin. Dairy Whole or 2% milk, cream, and half-and-half. Whole or full-fat cream cheese. Whole-fat or sweetened yogurt. Full-fat cheese. Nondairy creamers. Whipped toppings. Processed cheese and cheese spreads. Fats and oils Butter. Stick margarine. Lard. Shortening. Ghee. Bacon fat. Tropical oils, such as coconut, palm kernel, or palm oil. Seasoning and other foods Salted popcorn and pretzels. Onion salt, garlic salt, seasoned salt, table salt, and sea salt. Worcestershire sauce. Tartar sauce. Barbecue sauce. Teriyaki sauce. Soy sauce, including reduced-sodium. Steak sauce. Canned and packaged gravies. Fish sauce. Oyster sauce. Cocktail sauce. Horseradish that you find on the shelf. Ketchup. Mustard. Meat flavorings and tenderizers. Bouillon cubes. Hot sauce and Tabasco sauce. Premade or packaged marinades. Premade or packaged taco seasonings. Relishes. Regular salad dressings. Where to find more information:  National Heart, Lung, and [place]: https://wilson-eaton.com/  American Heart Association: www.heart.org Summary  The DASH eating plan is a healthy eating plan that has been shown to reduce  high blood pressure (hypertension). It may also reduce your risk for type 2 diabetes, heart disease, and stroke.  With the DASH eating plan, you should limit salt (sodium) intake to 2,300 mg a day. If you have hypertension, you may need to reduce your sodium intake to 1,500 mg a day.  When on the DASH eating plan, aim to eat more fresh fruits and vegetables, whole grains, lean proteins, low-fat dairy, and heart-healthy fats.  Work with your health care provider or diet and nutrition specialist (dietitian) to adjust your eating plan to your individual calorie needs. This information is not intended to replace advice given to you by your health care provider. Make sure you discuss any questions you have with your health care provider. Document Revised: 03/28/2017 Document Reviewed: 04/08/2016 Elsevier Patient Education  2020 [employer].

## 2019-05-31 ENCOUNTER — Other Ambulatory Visit: Payer: Self-pay

## 2019-05-31 ENCOUNTER — Encounter: Payer: Self-pay | Admitting: Family Medicine

## 2019-05-31 ENCOUNTER — Ambulatory Visit (INDEPENDENT_AMBULATORY_CARE_PROVIDER_SITE_OTHER): Payer: Medicare HMO | Admitting: Family Medicine

## 2019-05-31 VITALS — BP 144/66 | HR 80 | Temp 97.1°F | Ht 62.0 in | Wt 122.2 lb

## 2019-05-31 DIAGNOSIS — E7800 Pure hypercholesterolemia, unspecified: Secondary | ICD-10-CM

## 2019-05-31 DIAGNOSIS — Z5181 Encounter for therapeutic drug level monitoring: Secondary | ICD-10-CM

## 2019-05-31 DIAGNOSIS — Z7185 Encounter for immunization safety counseling: Secondary | ICD-10-CM

## 2019-05-31 DIAGNOSIS — R109 Unspecified abdominal pain: Secondary | ICD-10-CM

## 2019-05-31 DIAGNOSIS — D692 Other nonthrombocytopenic purpura: Secondary | ICD-10-CM

## 2019-05-31 DIAGNOSIS — I10 Essential (primary) hypertension: Secondary | ICD-10-CM | POA: Diagnosis not present

## 2019-05-31 DIAGNOSIS — Z0000 Encounter for general adult medical examination without abnormal findings: Secondary | ICD-10-CM | POA: Diagnosis not present

## 2019-05-31 DIAGNOSIS — R361 Hematospermia: Secondary | ICD-10-CM | POA: Diagnosis not present

## 2019-05-31 DIAGNOSIS — R7301 Impaired fasting glucose: Secondary | ICD-10-CM | POA: Diagnosis not present

## 2019-05-31 DIAGNOSIS — Z7189 Other specified counseling: Secondary | ICD-10-CM

## 2019-05-31 DIAGNOSIS — J3089 Other allergic rhinitis: Secondary | ICD-10-CM

## 2019-05-31 DIAGNOSIS — C61 Malignant neoplasm of prostate: Secondary | ICD-10-CM | POA: Diagnosis not present

## 2019-05-31 LAB — POCT URINALYSIS DIP (PROADVANTAGE DEVICE)
Bilirubin, UA: NEGATIVE
Blood, UA: NEGATIVE
Glucose, UA: NEGATIVE mg/dL
Ketones, POC UA: NEGATIVE mg/dL
Leukocytes, UA: NEGATIVE
Nitrite, UA: NEGATIVE
Protein Ur, POC: NEGATIVE mg/dL
Specific Gravity, Urine: 1.01
Urobilinogen, Ur: NEGATIVE
pH, UA: 6 (ref 5.0–8.0)

## 2019-05-31 LAB — POCT GLYCOSYLATED HEMOGLOBIN (HGB A1C): Hemoglobin A1C: 5.5 % (ref 4.0–5.6)

## 2019-05-31 MED ORDER — LISINOPRIL 10 MG PO TABS: 10.0000 mg | ORAL_TABLET | Freq: Every day | ORAL | 1 refills | Status: DC

## 2019-06-01 LAB — CBC WITH DIFFERENTIAL/PLATELET
Basophils Absolute: 0 10*3/uL (ref 0.0–0.2)
Basos: 1 %
EOS (ABSOLUTE): 0.1 10*3/uL (ref 0.0–0.4)
Eos: 2 %
Hematocrit: 44.9 % (ref 37.5–51.0)
Hemoglobin: 15.2 g/dL (ref 13.0–17.7)
Immature Grans (Abs): 0 10*3/uL (ref 0.0–0.1)
Immature Granulocytes: 0 %
Lymphocytes Absolute: 1.8 10*3/uL (ref 0.7–3.1)
Lymphs: 39 %
MCH: 32.3 pg (ref 26.6–33.0)
MCHC: 33.9 g/dL (ref 31.5–35.7)
MCV: 96 fL (ref 79–97)
Monocytes Absolute: 0.6 10*3/uL (ref 0.1–0.9)
Monocytes: 13 %
Neutrophils Absolute: 2.1 10*3/uL (ref 1.4–7.0)
Neutrophils: 45 %
Platelets: 224 10*3/uL (ref 150–450)
RBC: 4.7 x10E6/uL (ref 4.14–5.80)
RDW: 11.9 % (ref 11.6–15.4)
WBC: 4.7 10*3/uL (ref 3.4–10.8)

## 2019-06-01 LAB — LIPID PANEL
Chol/HDL Ratio: 2.3 ratio (ref 0.0–5.0)
Cholesterol, Total: 212 mg/dL — ABNORMAL HIGH (ref 100–199)
HDL: 92 mg/dL (ref >39)
LDL Chol Calc (NIH): 108 mg/dL — ABNORMAL HIGH (ref 0–99)
Triglycerides: 65 mg/dL (ref 0–149)
VLDL Cholesterol Cal: 12 mg/dL (ref 5–40)

## 2019-06-01 LAB — COMPREHENSIVE METABOLIC PANEL
ALT: 21 IU/L (ref 0–44)
AST: 23 IU/L (ref 0–40)
Albumin/Globulin Ratio: 2.4 — ABNORMAL HIGH (ref 1.2–2.2)
Albumin: 4.6 g/dL (ref 3.8–4.8)
Alkaline Phosphatase: 73 IU/L (ref 39–117)
BUN/Creatinine Ratio: 13 (ref 10–24)
BUN: 14 mg/dL (ref 8–27)
Bilirubin Total: 0.7 mg/dL (ref 0.0–1.2)
CO2: 22 mmol/L (ref 20–29)
Calcium: 9.4 mg/dL (ref 8.6–10.2)
Chloride: 104 mmol/L (ref 96–106)
Creatinine, Ser: 1.07 mg/dL (ref 0.76–1.27)
GFR calc Af Amer: 81 mL/min/{1.73_m2} (ref >59)
GFR calc non Af Amer: 70 mL/min/{1.73_m2} (ref >59)
Globulin, Total: 1.9 g/dL (ref 1.5–4.5)
Glucose: 106 mg/dL — ABNORMAL HIGH (ref 65–99)
Potassium: 4.6 mmol/L (ref 3.5–5.2)
Sodium: 141 mmol/L (ref 134–144)
Total Protein: 6.5 g/dL (ref 6.0–8.5)

## 2019-06-03 DIAGNOSIS — R69 Illness, unspecified: Secondary | ICD-10-CM | POA: Diagnosis not present

## 2019-06-05 ENCOUNTER — Ambulatory Visit: Payer: Medicare HMO | Attending: Internal Medicine

## 2019-06-05 DIAGNOSIS — Z23 Encounter for immunization: Secondary | ICD-10-CM | POA: Insufficient documentation

## 2019-06-05 NOTE — Progress Notes (Signed)
   Covid-19 Vaccination Clinic  Name:  Gary Blair    MRN: [id number] DOB: [birth year]  06/05/2019  Gary Blair was observed post Covid-19 immunization for 15 minutes without incidence. He was provided with Vaccine Information Sheet and instruction to access the V-Safe system.   Gary Blair was instructed to call 911 with any severe reactions post vaccine: Marland Kitchen Difficulty breathing  . Swelling of your face and throat  . A fast heartbeat  . A bad rash all over your body  . Dizziness and weakness    Immunizations Administered    Name Date Dose VIS Date Route   Pfizer COVID-19 Vaccine 06/05/2019  4:21 PM 0.3 mL 04/09/2019 Intramuscular   Manufacturer: [place]   Lot: [id number]   [place]: [id number]

## 2019-06-18 ENCOUNTER — Ambulatory Visit: Payer: Medicare HMO

## 2019-06-30 ENCOUNTER — Ambulatory Visit: Payer: Medicare HMO | Attending: Internal Medicine

## 2019-06-30 DIAGNOSIS — Z23 Encounter for immunization: Secondary | ICD-10-CM

## 2019-06-30 NOTE — Progress Notes (Signed)
   Covid-19 Vaccination Clinic  Name:  Gary Blair    MRN: [id number] DOB: 02/04/1951  06/30/2019  [name] was observed post Covid-19 immunization for 15 minutes without incident. He was provided with Vaccine Information Sheet and instruction to access the V-Safe system.   [name] was instructed to call 911 with any severe reactions post vaccine: Marland Kitchen Difficulty breathing  . Swelling of face and throat  . A fast heartbeat  . A bad rash all over body  . Dizziness and weakness   Immunizations Administered    Name Date Dose VIS Date Route   Pfizer COVID-19 Vaccine 06/30/2019 12:39 PM 0.3 mL 04/09/2019 Intramuscular   Manufacturer: [place]   Lot: [id number]   [place]: [id number]

## 2019-07-07 DIAGNOSIS — L814 Other melanin hyperpigmentation: Secondary | ICD-10-CM | POA: Diagnosis not present

## 2019-07-07 DIAGNOSIS — Z85828 Personal history of other malignant neoplasm of skin: Secondary | ICD-10-CM | POA: Diagnosis not present

## 2019-07-07 DIAGNOSIS — L821 Other seborrheic keratosis: Secondary | ICD-10-CM | POA: Diagnosis not present

## 2019-07-07 DIAGNOSIS — D225 Melanocytic nevi of trunk: Secondary | ICD-10-CM | POA: Diagnosis not present

## 2019-07-07 DIAGNOSIS — D2271 Melanocytic nevi of right lower limb, including hip: Secondary | ICD-10-CM | POA: Diagnosis not present

## 2019-07-07 DIAGNOSIS — L570 Actinic keratosis: Secondary | ICD-10-CM | POA: Diagnosis not present

## 2019-07-12 DIAGNOSIS — R69 Illness, unspecified: Secondary | ICD-10-CM | POA: Diagnosis not present

## 2019-07-13 ENCOUNTER — Other Ambulatory Visit: Payer: Self-pay | Admitting: Family Medicine

## 2019-07-13 DIAGNOSIS — J3089 Other allergic rhinitis: Secondary | ICD-10-CM

## 2019-10-25 DIAGNOSIS — R69 Illness, unspecified: Secondary | ICD-10-CM | POA: Diagnosis not present

## 2019-11-29 ENCOUNTER — Encounter: Payer: Medicare HMO | Admitting: Family Medicine

## 2019-12-05 NOTE — Progress Notes (Signed)
Chief Complaint  Patient presents with  . Hypertension    nonfasting med check, no concerns. No weight gain and patient feels like his diet is good.     Patient presents for 6 month med check. He was asked to return between wellness visits due to BP being elevated at his visit in February  Hypertension. He is compliant with taking lisinopril daily and denies side effects. Some cough related to PND (which was present prior to starting the ACEI), and sometimes related to what he eats (ie popcorn, peanuts).  His BP was elevated in February--he hadn't been checking his BP elsewhere.  He was asked to monitor more regularly, cut back on sodium (had been having some soups), and return sooner than 6 months if BP remained above goal. BP Readings from Last 3 Encounters:  05/31/19 (!) 144/66  05/27/18 130/84  05/26/17 126/80   He has retired since his last visit.  No longer eating Nabs, and other salty snacks.  Eating a healthier diet at home.  BP's are checked at [facility], and have been running low 90's-120/60's-70. Denies headaches, dizziness, chest pain, palpitations, edema.   Hypercholesterolemia:  LDL went up from 97 to 144 last year, but was back down to 108 on recheck in 05/2019.  He continues to try and limit cholesterol in his diet. Red meat once or twice a week, 2 eggs/week. +pimiento cheese. Rarely has creamy soups/sauces/dressings. Denies changes to his diet since 05/2019. Lab Results  Component Value Date   CHOL 212 (H) 05/31/2019   HDL 92 05/31/2019   LDLCALC 108 (H) 05/31/2019   TRIG 65 05/31/2019   CHOLHDL 2.3 05/31/2019   Impaired fasting glucose: he doesn't check sugars. He tries to limit sugar in his diet (candy, sweets, no regular sodas or sweet tea--very rare).  He eats sugar-free ice cream (most of the time).  Alcohol--average of 1 drink per day now, total of 10-12/week, more if doing yardwork, or watching a game. No change since he retired. Lab Results  Component Value  Date   HGBA1C 5.5 05/31/2019    PMH, PSH, SH reviewed  Outpatient Encounter Medications as of 12/06/2019  Medication Sig Note  . aspirin 81 MG tablet Take 81 mg by mouth daily.   . fluticasone (FLONASE) 50 MCG/ACT nasal spray Use 2 spray(s) in each nostril once daily   . lisinopril (ZESTRIL) 10 MG tablet Take 1 tablet (10 mg total) by mouth daily.   . Multiple Vitamins-Minerals (MULTIVITAMIN WITH MINERALS) tablet Take 1 tablet by mouth daily.   . Omega-3 Fatty Acids (FISH OIL) 1200 MG CAPS Take 1 capsule by mouth daily.   . vitamin C (ASCORBIC ACID) 500 MG tablet Take 500 mg by mouth daily.   . [DISCONTINUED] Omega-3 Fatty Acids (OMEGA 3 500 PO) Take 1 capsule by mouth daily. 05/26/2017: Unsure of dose   No facility-administered encounter medications on file as of 12/06/2019.   Allergies  Allergen Reactions  . Augmentin [Amoxicillin-Pot Clavulanate] Swelling  . Ciprofloxacin Hcl Other (See Comments)    Stated reddened skin  . Ibuprofen Itching  . Penicillins     Pruritis, joint pains,throat swelling when he took Augmentin  . Sulfa Antibiotics Rash    ROS: no fever, chills, URI symptoms, headaches, dizziness, chest pain, shortness of breath. No nausea, vomiting, heartburn, bowel changes. Morning cough from postnasal drainage, unchanged. No bleeding, bruising, rash. Moods are good. He has carpal tunnel on the left, tingling at night. Wears brace to sleep at night.  PHYSICAL EXAM:  BP 110/70   Pulse 84   Ht 5\' 2"  (1.575 m)   Wt 121 lb 3.2 oz (55 kg)   BMI 22.17 kg/m   124/70 on repeat by MD  Wt Readings from Last 3 Encounters:  12/06/19 121 lb 3.2 oz (55 kg)  05/31/19 122 lb 3.2 oz (55.4 kg)  05/27/18 131 lb (59.4 kg)   Well developed, well nourished patient, in no distress, in good spirits HEENT: conjunctiva and sclera are clear, EOMI.  Wearing mask Neck: No lymphadenopathy or thyromegaly, no carotid bruit Heart:  Regular rate and rhythm, no murmurs, rubs, gallops or  ectopy Lungs:  Clear bilaterally, without wheezes, rales or ronchi Abdomen:  Soft, nontender, nondistended, no hepatosplenomegaly or masses, normal bowel sounds Extremities:  No clubbing, cyanosis or edema, 2+ pulses.  Neuro:  Alert and oriented x 3. Normal strength, gait Back:  No spine or CVA tenderness Skin: no rashes or suspicious lesions Psych:  Normal mood, affect, hygiene and grooming, normal speech, eye contact    ASSESSMENT/PLAN:  Essential hypertension, benign - BP normal, some lower values at home.  consider cutting dose in 1/2 if consistently SBP<110. - Plan: lisinopril (ZESTRIL) 10 MG tablet  IFG (impaired fasting glucose) - continue/reviewed proper diet, continue regular exercise.  Pure hypercholesterolemia - continue lowfat, low cholesterol diet, controlled per last check  Chronic non-seasonal allergic rhinitis - continue flonase, reviewed proper technique. Trial atrovent spray - Plan: fluticasone (FLONASE) 50 MCG/ACT nasal spray  F/u as scheduled for wellness visit in 05/2020

## 2019-12-06 ENCOUNTER — Ambulatory Visit (INDEPENDENT_AMBULATORY_CARE_PROVIDER_SITE_OTHER): Payer: Medicare HMO | Admitting: Family Medicine

## 2019-12-06 ENCOUNTER — Other Ambulatory Visit: Payer: Self-pay

## 2019-12-06 ENCOUNTER — Encounter: Payer: Self-pay | Admitting: Family Medicine

## 2019-12-06 VITALS — BP 110/70 | HR 84 | Ht 62.0 in | Wt 121.2 lb

## 2019-12-06 DIAGNOSIS — J3089 Other allergic rhinitis: Secondary | ICD-10-CM | POA: Diagnosis not present

## 2019-12-06 DIAGNOSIS — I10 Essential (primary) hypertension: Secondary | ICD-10-CM

## 2019-12-06 DIAGNOSIS — E7800 Pure hypercholesterolemia, unspecified: Secondary | ICD-10-CM | POA: Diagnosis not present

## 2019-12-06 DIAGNOSIS — R7301 Impaired fasting glucose: Secondary | ICD-10-CM

## 2019-12-06 MED ORDER — LISINOPRIL 10 MG PO TABS: 10.0000 mg | ORAL_TABLET | Freq: Every day | ORAL | 1 refills | Status: DC

## 2019-12-06 MED ORDER — FLUTICASONE PROPIONATE 50 MCG/ACT NA SUSP: NASAL | 1 refills | Status: DC

## 2019-12-06 NOTE — Patient Instructions (Signed)
Your blood pressure is excellent.  Goal is under 130/80. It doesn't need to be consistently under 295 systolic. If you start to notice any dizziness (at the lower blood pressures), or if the systolic blood pressure is always under 110, and frequently under 90, then I recommend trying a lower dose of medication.  You can then start to cut the tablets in half, and continue to monitor your blood pressure. If the blood pressure goes over 130/80 regularly, then restart the full tablet.  I think the lower salt diet, regular exercise and retirement have all contributed to your blood pressure being lower.  If it stays very low, we can consider cutting back the dose of your medication (even if you don't feel dizzy).

## 2019-12-13 DIAGNOSIS — R69 Illness, unspecified: Secondary | ICD-10-CM | POA: Diagnosis not present

## 2019-12-20 DIAGNOSIS — R69 Illness, unspecified: Secondary | ICD-10-CM | POA: Diagnosis not present

## 2019-12-22 ENCOUNTER — Ambulatory Visit (INDEPENDENT_AMBULATORY_CARE_PROVIDER_SITE_OTHER): Payer: Medicare HMO | Admitting: Family Medicine

## 2019-12-22 ENCOUNTER — Encounter: Payer: Self-pay | Admitting: Family Medicine

## 2019-12-22 ENCOUNTER — Other Ambulatory Visit: Payer: Self-pay

## 2019-12-22 VITALS — BP 118/64 | HR 80 | Temp 98.4°F | Ht 62.0 in | Wt 120.4 lb

## 2019-12-22 DIAGNOSIS — M79601 Pain in right arm: Secondary | ICD-10-CM | POA: Diagnosis not present

## 2019-12-22 DIAGNOSIS — S40021A Contusion of right upper arm, initial encounter: Secondary | ICD-10-CM

## 2019-12-22 NOTE — Progress Notes (Signed)
Chief Complaint  Patient presents with  . Bleeding/Bruising    right bicep area-noticed bruising while at the gym about a week ago. There is a knot/swelling that he can feel. Some discomfort.    He was at the gym about a week ago, felt a slight discomfort in the R biceps area (can't recall which activity, possibly pulling down the bar).  He kept exercising The next day or two he noticed increased soreness.  He cut back on the weights at the gym, but kept going and using weights.   He reports that he noticed the bruising 2 days after the discomfort at the gym, which is now about a week ago.  He notices a firm knot which is sore at the top portion of the bruised area.   PMH, PSH, SH reviewed  Outpatient Encounter Medications as of 12/22/2019  Medication Sig  . aspirin 81 MG tablet Take 81 mg by mouth daily.  . fluticasone (FLONASE) 50 MCG/ACT nasal spray Use 2 spray(s) in each nostril once daily  . lisinopril (ZESTRIL) 10 MG tablet Take 1 tablet (10 mg total) by mouth daily.  . Multiple Vitamins-Minerals (MULTIVITAMIN WITH MINERALS) tablet Take 1 tablet by mouth daily.  . Omega-3 Fatty Acids (FISH OIL) 1200 MG CAPS Take 1 capsule by mouth daily.  . vitamin C (ASCORBIC ACID) 500 MG tablet Take 500 mg by mouth daily.   No facility-administered encounter medications on file as of 12/22/2019.   Allergies  Allergen Reactions  . Augmentin [Amoxicillin-Pot Clavulanate] Swelling  . Ciprofloxacin Hcl Other (See Comments)    Stated reddened skin  . Ibuprofen Itching  . Penicillins     Pruritis, joint pains,throat swelling when he took Augmentin  . Sulfa Antibiotics Rash   ROS: no fever, chills, URI symptoms, no bleeding.  R arm bruising/swelling/pain per HPI.  No GI complaints or other bleeding or concerns.   PHYSICAL EXAM:  BP 118/64   Pulse 80   Temp 98.4 F (36.9 C) (Tympanic)   Ht 5\' 2"  (1.575 m)   Wt 120 lb 6.4 oz (54.6 kg)   BMI 22.02 kg/m   Pleasant male, in no distress, in  good spirits. He is alert and oriented, normal mood, affect, hygiene and grooming  RUE: Large ecchymosis over the anterior aspect over the bicep muscle.  Much of the bruise is yellow in color, though inferiorly and medially there is a darker/purple area There is a firm area at superior aspect, medially Tender in this area, nontender elsewhere Some overlying STS over entire bicep area. There does not appear to be any displacement or abnormal bulging with contractin of biceps muscle.  Normal strength. Normal pulses, brisk cap refill   ASSESSMENT/PLAN:  Right arm pain  Superficial bruising of arm, right, initial encounter - no direct trauma. Suspect small tear of either muscle fibers or small portion of tendon (no e/o rupture).  Supportive measures and REST   Please REST your upper extremity for the next week. You can apply some moist heat to the firm/tender area in the upper portion of the arm. The bruising should continue to lighten (though may spread downward in the warm but not be painful). Next week, if you aren't noting significant improvement, if you still have any discomfort/pain or swelling, I will refer you to sports medicine clinic (where they will evaluate you, likely to include ultrasound of the arm/muscle) I suspect there is a small tear/injury that is causing the swelling and bruising, and this needs to  heal.

## 2019-12-22 NOTE — Patient Instructions (Signed)
  Please REST your upper extremity for the next week. You can apply some moist heat to the firm/tender area in the upper portion of the arm. The bruising should continue to lighten (though may spread downward in the warm but not be painful). Next week, if you aren't noting significant improvement, if you still have any discomfort/pain or swelling, I will refer you to sports medicine clinic (where they will evaluate you, likely to include ultrasound of the arm/muscle) I suspect there is a small tear/injury that is causing the swelling and bruising, and this needs to heal.

## 2020-01-04 DIAGNOSIS — R69 Illness, unspecified: Secondary | ICD-10-CM | POA: Diagnosis not present

## 2020-01-10 ENCOUNTER — Telehealth: Payer: Self-pay | Admitting: Family Medicine

## 2020-01-10 NOTE — Telephone Encounter (Signed)
Patient scheduled with Dr.Draper at [place] 01/14/20 @ 945.

## 2020-01-10 NOTE — Telephone Encounter (Signed)
Please refer to sports medicine (not sure if he has location preference) See notes from last visit

## 2020-01-10 NOTE — Telephone Encounter (Signed)
Pt called and states that he is still having the arm pain, he wants to know if you still think he should go to the sports dr and be evaluated. States the pain has never really went away, he can be reached at [phone]

## 2020-01-14 ENCOUNTER — Other Ambulatory Visit: Payer: Self-pay

## 2020-01-14 ENCOUNTER — Ambulatory Visit: Payer: Medicare HMO | Admitting: Sports Medicine

## 2020-01-14 ENCOUNTER — Encounter: Payer: Self-pay | Admitting: Sports Medicine

## 2020-01-14 VITALS — BP 122/74 | Ht 64.0 in | Wt 120.0 lb

## 2020-01-14 DIAGNOSIS — S46119A Strain of muscle, fascia and tendon of long head of biceps, unspecified arm, initial encounter: Secondary | ICD-10-CM | POA: Diagnosis not present

## 2020-01-14 NOTE — Patient Instructions (Signed)
Thank you for coming in to see [place] today! You have a partial tear of the long head of your biceps tendon.  This is very common and does not require surgical intervention.  Please see below to review our plan for today's visit:   You may restart your regular exercise plan, but please start with very light weights and progress gradually.  Stop if you have any pain.   Please call the clinic at [phone] if your symptoms worsen or you have any concerns. It was our pleasure to serve you.       Dr. Dagoberto Ligas [facility]

## 2020-01-14 NOTE — Progress Notes (Signed)
PCP: Rita Ohara, MD  Subjective:   HPI: Patient is a 69 y.o. male here for evaluation of right arm injury.  He reports about 3 to 4 weeks ago, he was working out in [employer].  He does some resistance training and believes he is doing overhead LAT pulls when he felt a bit of a twinge in his right shoulder.  It was not very painful and he continued to exercise but the next day he had significant bruising over his biceps and he noted a bit of a deformity in his right biceps muscle.  He went to his PCP, who thought he likely had a partial tear of his biceps and sent him here for further evaluation.  Today, patient states that he is not really having much pain, perhaps a very mild pain in his anterior shoulder.  He has not had pain throughout this.  He has been resting for many upper body work until he was seen here.  He has not noticed any significant strength deficits.  No numbness or tingling.   Review of Systems:  Per HPI.   [place], medications and smoking status reviewed.      Objective:  Physical Exam:  [place] Adult Exercise 01/14/2020  Frequency of aerobic exercise (# of days/week) 5  Average time in minutes 60  Frequency of strengthening activities (# of days/week) 5     Gen: awake, alert, NAD, comfortable in exam room Pulm: breathing unlabored  Right arm: -Inspection: There is a small deformity over the lateral aspect of the biceps muscle compared to the contralateral side. -Palpation: Very mildly tender over the bicipital groove and distally in the biceps muscle belly -ROM: Intact in all planes of the shoulder and elbow -Strength: 5/5 strength with resisted flexion of the elbow, no noticeable difference compared to contralateral side -Special tests: Biceps tendon palpable with hook test distally, negative speeds, negative Yergason's  Limited ultrasound examination of the right arm shows intact biceps tendon in the bicipital groove proximally.  Just distal to this  over the lateral aspect of the biceps muscle there is a stump sign of the lateral biceps muscle. Impression: Findings consistent with partial musculotendinous long head of the biceps tear.   Assessment & Plan:  1.  Partial biceps muscle tear, right Patient with ultrasound findings and exam findings consistent with partial tear of the biceps muscle.  He is not having any pain or strength deficits.  Given this, this is not a surgical issue.  We will plan to progress back into his lifting activities starting with very low weights.  Advised to hold off on anything that causes pain.  Discussed the possibility of a complete tear being more likely, and the fact that this usually does not result in any real deficits and rarely require surgery.  Patient understands.  Follow-up as needed.   Dagoberto Ligas, MD [facility] Fellow 01/14/2020 10:28 AM   Patient seen and evaluated with the sports medicine fellow.  I agree with the above plan of care.  Limited [facility] ultrasound of the right arm with attention to the biceps shows a few intact fibers in the bicipital groove.  There is a stump sign distally suggesting a tear of the muscle at the musculotendinous junction.  Patient is asymptomatic at this point.  I think he can start to return to his exercise routine using pain as his guide.  There is no need for further imaging or treatment at this time.  Follow-up  as needed.

## 2020-01-21 DIAGNOSIS — N5201 Erectile dysfunction due to arterial insufficiency: Secondary | ICD-10-CM | POA: Diagnosis not present

## 2020-01-21 DIAGNOSIS — Z8546 Personal history of malignant neoplasm of prostate: Secondary | ICD-10-CM | POA: Diagnosis not present

## 2020-04-11 DIAGNOSIS — H52203 Unspecified astigmatism, bilateral: Secondary | ICD-10-CM | POA: Diagnosis not present

## 2020-04-11 DIAGNOSIS — H43813 Vitreous degeneration, bilateral: Secondary | ICD-10-CM | POA: Diagnosis not present

## 2020-04-11 DIAGNOSIS — H2513 Age-related nuclear cataract, bilateral: Secondary | ICD-10-CM | POA: Diagnosis not present

## 2020-04-11 DIAGNOSIS — H35371 Puckering of macula, right eye: Secondary | ICD-10-CM | POA: Diagnosis not present

## 2020-06-07 NOTE — Patient Instructions (Incomplete)
  HEALTH MAINTENANCE RECOMMENDATIONS:  It is recommended that you get at least 30 minutes of aerobic exercise at least 5 days/week (for weight loss, you may need as much as 60-90 minutes). This can be any activity that gets your heart rate up. This can be divided in 10-15 minute intervals if needed, but try and build up your endurance at least once a week.  Weight bearing exercise is also recommended twice weekly.  Eat a healthy diet with lots of vegetables, fruits and fiber.  "Colorful" foods have a lot of vitamins (ie green vegetables, tomatoes, red peppers, etc).  Limit sweet tea, regular sodas and alcoholic beverages, all of which has a lot of calories and sugar.  Up to 2 alcoholic drinks daily may be beneficial for men (unless trying to lose weight, watch sugars).  Drink a lot of water.  Sunscreen of at least SPF 30 should be used on all sun-exposed parts of the skin when outside between the hours of 10 am and 4 pm (not just when at beach or pool, but even with exercise, golf, tennis, and yard work!)  Use a sunscreen that says "broad spectrum" so it covers both UVA and UVB rays, and make sure to reapply every 1-2 hours.  Remember to change the batteries in your smoke detectors when changing your clock times in the spring and fall.  Carbon monoxide detectors are recommended for your home.  Use your seat belt every time you are in a car, and please drive safely and not be distracted with cell phones and texting while driving.    Mr. [place] , Thank you for taking time to come for your Medicare Wellness Visit. I appreciate your ongoing commitment to your health goals. Please review the following plan we discussed and let me know if I can assist you in the future.   This is a list of the screening recommended for you and due dates:  Health Maintenance  Topic Date Due  . Flu Shot  11/28/2019  . Colon Cancer Screening  2024 (5 years from last)  . COVID-19 Vaccine (4 - Booster for Pfizer series)  08/01/2020  . Tetanus Vaccine  05/19/2025  .  Hepatitis C: One time screening is recommended by Center for Disease Control  (CDC) for  adults born from [birth year] through 1967.   Completed  . Pneumonia vaccines  Completed   Continue yearly high dose flu shots. Complete the Shingrix series at the pharmacy (next shot in April).  Your chest symptoms (and cough) may have a component of reflux.   Consider doing a 2 week trial of either Pepcid or prilosec or nexium with dinner or at bedtime to see if your cough or chest symptoms resolve.  You no longer need to take aspirin daily, based on current recommendations. You may continue if no side effects (stop if abdominal or chest pain, bleeding, or bruising).  It is no longer recommended for you.  Be sure to stay well hydrated (drink more water) Consider taking mucinex regularly (guaifenesin--an expectorant to keep the mucus thin).  You can also take claritin or zyrtec as needed to help with any allergies not adequately treated by the Flonase  Your cough is likely due to postnasal drainage, but may also have a component from reflux, and possibly also from your lisinopril.

## 2020-06-07 NOTE — Progress Notes (Signed)
Chief Complaint  Patient presents with  . Medicare Wellness    Fasting AWV. Ears have been bothering him-hearing loss. Still has cough/congestion-thinks it's from his lisinopril, you are aware of this. Pain in his lower abdomen x several months, wonders if he pulled something at the gym.     Gary Blair is a 70 y.o. male who presents for annual physical exam, Medicare wellness visit and follow-up on chronic medical conditions.  He has the following concerns:  Lower abdominal pain, described as dull.  It is located at the RLQ/groin, sometimes across the center of the lower abdomen. He thought maybe he strained or pulled something at the gym.  Pain started 2 months ago, no significant change.  Pain is off/on.  Denies any pattern.  Denies bowel or urinary issues, not related to eating, no pain when exercising. Denies any swelling in the area.  He has noticed the discomfort in bed, but doesn't wake or keep him up.  Pain comes and goes throughout the day, short-lived (up to 30 minutes, usually less), resolves on its own.  He was last seen in August with RUE pain, and followed up with Dr. Lindwood Qua biceps muscle tear on R. He still notices the "popeye" deformity, doesn't have pain or weakness.  Has some shoulder pain on the R.  Hypertension. He is compliant with takinglisinopril daily. Some cough related to PND (which was present prior to starting the ACEI), and sometimes related to what he eats (ie popcorn, peanuts). Last year BP's were up some, when having more soups.  He has been trying to limit the sodium in his diet.  BP's are good. Last check was at [facility] 134/74. Denies headaches, dizziness, chest pain, palpitations, edema.  BP Readings from Last 3 Encounters:  01/14/20 122/74  12/22/19 118/64  12/06/19 110/70   He feels like the cough/congestion is about the same as in the past.  Some days doesn't have a problem, other days has coughing spells 2x/day. Sometimes he gets the  tickle in his throat for no reason, when in a recliner. Sometimes gets up phlegm (clear or yellow).  He has some sinus drainage, usually clear (occasionally yellow). He uses Flonase every night--used to have more trouble with nasal congestion at night, improved (used to take neosinephrine regularly).  Hypercholesterolemia: LDL went up from 97 to 144 in 2020, but was back down to 108 on recheck in 05/2019. He continues to try and limit cholesterol in his diet. Red meat once or twice a week, 2 eggs/week. +pimiento cheese. Rarely has creamy soups/sauces/dressings. Denies changes to his diet since last visit. Lab Results  Component Value Date   CHOL 212 (H) 05/31/2019   HDL 92 05/31/2019   LDLCALC 108 (H) 05/31/2019   TRIG 65 05/31/2019   CHOLHDL 2.3 05/31/2019   Impaired fasting glucose: he doesn't check sugars. He tries to limit sugar in his diet (candy, sweets, no regular sodas or sweet tea--very rare).  He eatssugar-freeice cream (most of the time).  Alcohol--average of 1 drink per daynow, maybe 2-3 on a weekend, total of up to 10/week. Lab Results  Component Value Date   HGBA1C 5.5 05/31/2019    Prostate Cancer: He is s/p brachytherapy 09/2014. He last saw urologist in 12/2019, doing well.He denies any significant urinary complaints (slight hesitancy and weakened stream, unchanged)   Immunization History  Administered Date(s) Administered  . Influenza Split 03/16/2005, 02/15/2006, 02/01/2007, 02/25/2008, 04/20/2009, 01/28/2012  . Influenza Whole 02/09/2013  . Influenza, High Dose Seasonal  PF 01/23/2016, 01/27/2017, 01/04/2018, 12/20/2018, 12/29/2019  . Influenza,trivalent, recombinat, inj, PF 01/08/2014  . Influenza-Unspecified 01/01/2015, 01/04/2018  . PFIZER(Purple Top)SARS-COV-2 Vaccination 06/05/2019, 06/30/2019, 02/01/2020  . Pneumococcal Conjugate-13 04/11/2014  . Pneumococcal Polysaccharide-23 12/14/2004, 04/17/2015  . Td 06/27/1989, 09/30/2001  . Tdap 05/20/2015   . Zoster 04/26/2011  . Zoster Recombinat (Shingrix) 05/30/2020   Last colonoscopy: 06/2017 Dr. Erlene Quan [facility]); we did not receive final report (only "technical component").  He was told to f/u in 5 years. Had tubular adenoma in 03/2014. Last PSA: per urologist, UTD Dentist: twice yearly  Ophtho: yearly Exercise:  Going to [employer] 4-5x/week (cardio x 30-50 mins, plus weight machines) Lipids: Lab Results  Component Value Date   CHOL 212 (H) 05/31/2019   HDL 92 05/31/2019   LDLCALC 108 (H) 05/31/2019   TRIG 65 05/31/2019   CHOLHDL 2.3 05/31/2019    Other doctors caring for patient include: Ophtho: Dr. Satira Sark Derm: Dr. Ubaldo Glassing Dentist: Dr.Saraf GI: Dr. Erlene Quan ([place] [place], in [place], retired) Urology: Dr. Diona Fanti (Radiation onc: Dr. Valere Dross) Sports Med: Dr. Micheline Chapman  Depression screen: negative Fall screen: negative Functional status screen: notable for bilateral hearing loss, intermittently, thinks related to wax Mini-Cog screen: normal See full screen in Epic.  End of Life Discussion: Patient hasa living will and medical power of attorney(and brought in 03/2015)   PMH, PSH, SH and FH were reviewed and updated  Outpatient Encounter Medications as of 06/08/2020  Medication Sig  . aspirin 81 MG tablet Take 81 mg by mouth daily.  . fluticasone (FLONASE) 50 MCG/ACT nasal spray Use 2 spray(s) in each nostril once daily  . lisinopril (ZESTRIL) 10 MG tablet Take 1 tablet (10 mg total) by mouth daily.  . Multiple Vitamins-Minerals (MULTIVITAMIN WITH MINERALS) tablet Take 1 tablet by mouth daily.  . Omega-3 Fatty Acids (FISH OIL) 1200 MG CAPS Take 1 capsule by mouth daily.  . vitamin C (ASCORBIC ACID) 500 MG tablet Take 500 mg by mouth daily.   No facility-administered encounter medications on file as of 06/08/2020.   Allergies  Allergen Reactions  . Augmentin [Amoxicillin-Pot Clavulanate] Swelling  . Ciprofloxacin Hcl Other (See Comments)    Stated reddened  skin  . Ibuprofen Itching  . Penicillins     Pruritis, joint pains,throat swelling when he took Augmentin  . Sulfa Antibiotics Rash    ROS: The patient denies fever, chills, Denies headaches, vision loss, ear pain, palpitations, dizziness, syncope, dyspnea on exertion, swelling, constipation, melena, hematochezia, hematuria, genital lesions, weakness, tremor, suspicious skin lesions(sees derm regularly); denies depression, anxiety, abnormal bleeding/bruising (some bruising related to aspirin)., or enlarged lymph nodes.  He has carpal tunnel on the left, tingling at night. Wears brace to sleep most nights. He has some back stiffness in the morning, only rare/minor knee pain.  Has some pain in his wrists, which he says is arthritis.   Chronic PND, not as well controlled with Flonase as in the past. Some hesitancy and weakened stream, unchanged. No nocturia or incontinence. +ED. No further hematospermia.  Itching on his chest, gets a little red. Usually in the summer (heat?), often in the late afternoon.  Not related to exercise/sweating; not currently itchy. Started during the summer. Ear plugging, decreased hearing. Every once in a while he gets a pain in his chest, dull, in the center or slightly to the right. "no rhyme or reason"--notices at night. No pain with activity/exertion.   Rare heartburn, uses nexium or prilosec with good results, prn. Lower abdominal discomfort per HPI  PHYSICAL EXAM:  BP 130/80   Pulse 68   Ht '5\' 3"'  (1.6 m)   Wt 127 lb (57.6 kg)   BMI 22.50 kg/m   Wt Readings from Last 3 Encounters:  06/08/20 127 lb (57.6 kg)  01/14/20 120 lb (54.4 kg)  12/22/19 120 lb 6.4 oz (54.6 kg)     General Appearance:  Alert, cooperative, no distress, appears stated age   Head:  Normocephalic, without obvious abnormality, atraumatic   Eyes:  PERRL, conjunctiva/corneas clear, EOM's intact, fundi benign   Ears:  Normal TM's and external ear canals   Nose:  Nasal mucosa is mildly edematous, clear mucus noted bilaterally  Throat:  Normal (brief exam performed of nose, OP; wearing mask during visit)  Neck:  Supple, no lymphadenopathy; thyroid: no enlargement/tenderness/nodules; no carotid bruit or JVD.   Back:  Spine nontender, no curvature, ROM normal, no CVA tenderness   Lungs:  Clear to auscultation bilaterally without wheezes, rales or ronchi; respirations unlabored   Chest Wall:  No tenderness or deformity   Heart:  Regularrate andrhythm, S1 and S2 normal, no murmur, rub or gallop   Breast Exam:  No chest wall tenderness, masses or gynecomastia   Abdomen:  Soft, non-tender, nondistended, normoactive bowel sounds, no masses, no hepatosplenomegaly. No reproducible tenderness, no bulging.  Genitalia:  Nontender in inguinal canals, no hernia.  Full exam deferred to urologist  Rectal:  Deferred to urologist.  Extremities:  No clubbing, cyanosis or edema. Mild popeye deformity noted at R proximal biceps. Nontender, normal strength,  Pulses:  2+ and symmetric all extremities   Skin:  Skin color, texture, turgor normal, no rashes. Some small ecchymosis/purpura on his R hand and forearms  Lymph nodes:  Cervical, supraclavicular, and axillary nodes normal   Neurologic:  Normal strength, sensation and gait; reflexes 2+ and symmetric throughout    Psych: Normal mood, affect, hygiene and grooming   Normal urine dip, SG 1.010  ASSESSMENT/PLAN:  Annual physical exam - Plan: POCT Urinalysis DIP (Proadvantage Device), CBC with Differential/Platelet, Comprehensive metabolic panel, Lipid panel, Hemoglobin A1c  Medicare annual wellness visit, subsequent  Pure hypercholesterolemia - cont low cholesterol diet - Plan: Lipid panel  Prostate cancer ([place]) - monitored by urologist, stable - Plan: CBC with Differential/Platelet, Comprehensive metabolic panel  Medication monitoring  encounter - Plan: CBC with Differential/Platelet, Comprehensive metabolic panel  Senile purpura ([place]) - contributed by aspirin, age.  He may stop aspirin  IFG (impaired fasting glucose) - counseled re: proper diet, increased risk for developing DM - Plan: Comprehensive metabolic panel, Hemoglobin A1c  Chronic non-seasonal allergic rhinitis - continue flonase, reviewed proper technique. Trial atrovent spray - Plan: fluticasone (FLONASE) 50 MCG/ACT nasal spray  Essential hypertension, benign - controlled, cont lisinopril.  Offered to change to ARB but doubt his cough will resolve, since other factors contribute - Plan: Comprehensive metabolic panel, lisinopril (ZESTRIL) 10 MG tablet  Cough - Ddx reviewed--definitely has component of allergies/PND, may also have component of GERD.  Doubt significant ACEI contribution (had prior to starting, intermit)  Cbc, lipid, c-met, A1c (with labs) PSA done by urologist 12/2019  No evidence of hernia based on today's exam. May be strain.  To mention to urologist at f/u if persists.  Recommended at least 30 minutes of aerobic activity at least 5 days/week, weight-bearing exercise at least 2x/week; proper sunscreen use reviewed; healthy diet and alcohol recommendations (less than or equal to 2 drinks/day) reviewed; regular seatbelt use; changing batteries in smoke detectors. Immunization recommendations discussed--continue  yearly flu shots. To get 2nd Shingrix in 2 mos from pharmacy. Colonoscopy recommendations reviewed, UTD, due again 2024 (5 year follow-up).  Discussed aspirin (no longer recommended), CT calcium scores in detail. He is hesitant to stop taking it.  Reviewed potential risks.   MOST form reviewed and signed. Full Code, Full Care.  F/u 1 year for CPE/AWV  To return in 6 months for med check, since A1c came back at 6%. Nonfasting, recheck a1c at visit.   Your chest symptoms (and cough) may have a component of reflux.   Consider doing a  2 week trial of either Pepcid or prilosec or nexium with dinner or at bedtime to see if your cough or chest symptoms resolve.  You no longer need to take aspirin daily, based on current recommendations. You may continue if no side effects (stop if abdominal or chest pain, bleeding, or bruising).  It is no longer recommended for you.  Be sure to stay well hydrated (drink more water) Consider taking mucinex regularly (guaifenesin--an expectorant to keep the mucus thin).  You can also take claritin or zyrtec as needed to help with any allergies not adequately treated by the [facility] Attestation I have personally reviewed: The patient's medical and social history Their use of alcohol, tobacco or illicit drugs Their current medications and supplements The patient's functional ability including ADLs,fall risks, home safety risks, cognitive, and hearing and visual impairment Diet and physical activities Evidence for depression or mood disorders  The patient's weight, height, BMI have been recorded in the chart.  I have made referrals, counseling, and provided education to the patient based on review of the above and I have provided the patient with a written personalized care plan for preventive services.

## 2020-06-08 ENCOUNTER — Ambulatory Visit (INDEPENDENT_AMBULATORY_CARE_PROVIDER_SITE_OTHER): Payer: Medicare HMO | Admitting: Family Medicine

## 2020-06-08 ENCOUNTER — Encounter: Payer: Self-pay | Admitting: Family Medicine

## 2020-06-08 ENCOUNTER — Other Ambulatory Visit: Payer: Self-pay

## 2020-06-08 VITALS — BP 130/80 | HR 68 | Ht 63.0 in | Wt 127.0 lb

## 2020-06-08 DIAGNOSIS — Z0000 Encounter for general adult medical examination without abnormal findings: Secondary | ICD-10-CM | POA: Diagnosis not present

## 2020-06-08 DIAGNOSIS — D692 Other nonthrombocytopenic purpura: Secondary | ICD-10-CM | POA: Diagnosis not present

## 2020-06-08 DIAGNOSIS — R7301 Impaired fasting glucose: Secondary | ICD-10-CM | POA: Diagnosis not present

## 2020-06-08 DIAGNOSIS — I10 Essential (primary) hypertension: Secondary | ICD-10-CM

## 2020-06-08 DIAGNOSIS — J3089 Other allergic rhinitis: Secondary | ICD-10-CM | POA: Diagnosis not present

## 2020-06-08 DIAGNOSIS — H6980 Other specified disorders of Eustachian tube, unspecified ear: Secondary | ICD-10-CM

## 2020-06-08 DIAGNOSIS — Z5181 Encounter for therapeutic drug level monitoring: Secondary | ICD-10-CM

## 2020-06-08 DIAGNOSIS — E7800 Pure hypercholesterolemia, unspecified: Secondary | ICD-10-CM | POA: Diagnosis not present

## 2020-06-08 DIAGNOSIS — R059 Cough, unspecified: Secondary | ICD-10-CM | POA: Diagnosis not present

## 2020-06-08 DIAGNOSIS — C61 Malignant neoplasm of prostate: Secondary | ICD-10-CM | POA: Diagnosis not present

## 2020-06-08 DIAGNOSIS — H6990 Unspecified Eustachian tube disorder, unspecified ear: Secondary | ICD-10-CM

## 2020-06-08 LAB — POCT URINALYSIS DIP (PROADVANTAGE DEVICE)
Bilirubin, UA: NEGATIVE
Blood, UA: NEGATIVE
Glucose, UA: NEGATIVE mg/dL
Ketones, POC UA: NEGATIVE mg/dL
Leukocytes, UA: NEGATIVE
Nitrite, UA: NEGATIVE
Protein Ur, POC: NEGATIVE mg/dL
Specific Gravity, Urine: 1.01
Urobilinogen, Ur: NEGATIVE
pH, UA: 6 (ref 5.0–8.0)

## 2020-06-08 MED ORDER — LISINOPRIL 10 MG PO TABS: 10.0000 mg | ORAL_TABLET | Freq: Every day | ORAL | 3 refills | Status: DC

## 2020-06-08 MED ORDER — FLUTICASONE PROPIONATE 50 MCG/ACT NA SUSP: NASAL | 3 refills | Status: DC

## 2020-06-09 LAB — COMPREHENSIVE METABOLIC PANEL
ALT: 19 IU/L (ref 0–44)
AST: 23 IU/L (ref 0–40)
Albumin/Globulin Ratio: 2 (ref 1.2–2.2)
Albumin: 4.6 g/dL (ref 3.7–4.7)
Alkaline Phosphatase: 75 IU/L (ref 44–121)
BUN/Creatinine Ratio: 14 (ref 10–24)
BUN: 16 mg/dL (ref 8–27)
Bilirubin Total: 0.8 mg/dL (ref 0.0–1.2)
CO2: 19 mmol/L — ABNORMAL LOW (ref 20–29)
Calcium: 9.2 mg/dL (ref 8.6–10.2)
Chloride: 104 mmol/L (ref 96–106)
Creatinine, Ser: 1.17 mg/dL (ref 0.76–1.27)
GFR calc Af Amer: 72 mL/min/{1.73_m2} (ref >59)
GFR calc non Af Amer: 62 mL/min/{1.73_m2} (ref >59)
Globulin, Total: 2.3 g/dL (ref 1.5–4.5)
Glucose: 115 mg/dL — ABNORMAL HIGH (ref 65–99)
Potassium: 4 mmol/L (ref 3.5–5.2)
Sodium: 143 mmol/L (ref 134–144)
Total Protein: 6.9 g/dL (ref 6.0–8.5)

## 2020-06-09 LAB — CBC WITH DIFFERENTIAL/PLATELET
Basophils Absolute: 0 10*3/uL (ref 0.0–0.2)
Basos: 1 %
EOS (ABSOLUTE): 0.1 10*3/uL (ref 0.0–0.4)
Eos: 2 %
Hematocrit: 45.8 % (ref 37.5–51.0)
Hemoglobin: 15.2 g/dL (ref 13.0–17.7)
Immature Grans (Abs): 0 10*3/uL (ref 0.0–0.1)
Immature Granulocytes: 0 %
Lymphocytes Absolute: 2.1 10*3/uL (ref 0.7–3.1)
Lymphs: 44 %
MCH: 30.9 pg (ref 26.6–33.0)
MCHC: 33.2 g/dL (ref 31.5–35.7)
MCV: 93 fL (ref 79–97)
Monocytes Absolute: 0.7 10*3/uL (ref 0.1–0.9)
Monocytes: 13 %
Neutrophils Absolute: 2 10*3/uL (ref 1.4–7.0)
Neutrophils: 40 %
Platelets: 236 10*3/uL (ref 150–450)
RBC: 4.92 x10E6/uL (ref 4.14–5.80)
RDW: 12.1 % (ref 11.6–15.4)
WBC: 4.9 10*3/uL (ref 3.4–10.8)

## 2020-06-09 LAB — HEMOGLOBIN A1C
Est. average glucose Bld gHb Est-mCnc: 126 mg/dL
Hgb A1c MFr Bld: 6 % — ABNORMAL HIGH (ref 4.8–5.6)

## 2020-06-09 LAB — LIPID PANEL
Chol/HDL Ratio: 2.8 ratio (ref 0.0–5.0)
Cholesterol, Total: 216 mg/dL — ABNORMAL HIGH (ref 100–199)
HDL: 78 mg/dL (ref >39)
LDL Chol Calc (NIH): 130 mg/dL — ABNORMAL HIGH (ref 0–99)
Triglycerides: 45 mg/dL (ref 0–149)
VLDL Cholesterol Cal: 8 mg/dL (ref 5–40)

## 2020-06-21 ENCOUNTER — Encounter: Payer: Self-pay | Admitting: Family Medicine

## 2020-06-21 NOTE — Progress Notes (Signed)
Pt is coming in 08/25

## 2020-08-16 ENCOUNTER — Encounter: Payer: Self-pay | Admitting: Family Medicine

## 2020-08-16 ENCOUNTER — Telehealth (INDEPENDENT_AMBULATORY_CARE_PROVIDER_SITE_OTHER): Payer: Medicare HMO | Admitting: Family Medicine

## 2020-08-16 VITALS — BP 116/67 | Temp 97.6°F | Ht 64.0 in | Wt 128.0 lb

## 2020-08-16 DIAGNOSIS — R059 Cough, unspecified: Secondary | ICD-10-CM

## 2020-08-16 NOTE — Patient Instructions (Signed)
Stay well hydrated. Take Mucinex-DM 12 hour, take it twice daily. If this isn't adequately helping your cough, let us know and we can send in a prescription for tessalon perles (benzonatate).  Repeat COVID test tomorrow, and let us know if it is positive.  Let us know if you develop fever, worsening cough, shortness of breath, pain with breathing, or any other change in symptoms.

## 2020-08-16 NOTE — Progress Notes (Signed)
Start time: 9:03 End time: 9:23   Virtual Visit via Telephone Note  I connected with Gary Blair on 08/16/20 by telephone and verified that I am speaking with the correct person using two identifiers.  Location: Patient: home Provider: office   I discussed the limitations of evaluation and management by telemedicine and the availability of in person appointments. The patient expressed understanding and agreed to proceed.  History of Present Illness:  Chief Complaint  Patient presents with  . Sore Throat    PHONE CALL sore throat and cough with dark brown mucus. Cough is productive. No fever, chills or body aches. Symptoms started Sunday night. Home covid test done [facility] and Tuesday-both negative. Wife has also had sore throat-she was also negative. Diagnosed with URI and taking amox and feeling better today.    Symptoms started with sore throat 3 days ago.  This improved, but now he has a productive cough.  Phlegm is dark brown. He has a constant tickle in throat and cough.  +runny nose, clear. No sneezing, ear pain, itchy eyes. No fever.  Slight chills (? If related to cold temps) just at night.  Denies myalgias. Denies headaches or sinus pain.  Wife with similar symptoms, started before his.  She had sore throat and toothache, went to dentist, treated her for sinus infection, and is feeling better.  He is taking flonase regularly.  COVID test was negative yesterday.  PMH, PSH ,SH reviewed  Outpatient Encounter Medications as of 08/16/2020  Medication Sig  . fluticasone (FLONASE) 50 MCG/ACT nasal spray Use 2 spray(s) in each nostril once daily  . lisinopril (ZESTRIL) 10 MG tablet Take 1 tablet (10 mg total) by mouth daily.  . Multiple Vitamins-Minerals (MULTIVITAMIN WITH MINERALS) tablet Take 1 tablet by mouth daily.  . Omega-3 Fatty Acids (FISH OIL) 1200 MG CAPS Take 1 capsule by mouth daily.  . vitamin C (ASCORBIC ACID) 500 MG tablet Take 500 mg by mouth daily.  .  [DISCONTINUED] aspirin 81 MG tablet Take 81 mg by mouth daily.  . [DISCONTINUED] SHINGRIX injection    No facility-administered encounter medications on file as of 08/16/2020.   Allergies  Allergen Reactions  . Augmentin [Amoxicillin-Pot Clavulanate] Swelling  . Ciprofloxacin Hcl Other (See Comments)    Stated reddened skin  . Ibuprofen Itching  . Penicillins     Pruritis, joint pains,throat swelling when he took Augmentin  . Sulfa Antibiotics Rash   ROS:  See HPI.  URI symptoms. No nausea, vomiting, diarrhea.  +decreased appetite. Denies SOB/DOE    Observations/Objective:  BP 116/67   Temp 97.6 F (36.4 C) (Oral)   Ht 5\' 4"  (1.626 m)   Wt 128 lb (58.1 kg)   BMI 21.97 kg/m   Alert, oriented, no coughing during visit. Speaking easily, in no distress. In good spirits.  Assessment and Plan:   Cough - suspect URI given sick contact (wife) vs allergies. Supportive measures reviewed. Repeat COVID test tomorrow (done early)   Stay well hydrated. Take Mucinex-DM 12 hour, take it twice daily. If this isn't adequately helping your cough, let us know and we can send in a prescription for tessalon perles (benzonatate).  Repeat COVID test tomorrow, and let us know if it is positive.  Let us know if you develop fever, worsening cough, shortness of breath, pain with breathing, or any other change in symptoms.   Follow Up Instructions:    I discussed the assessment and treatment plan with the patient. The patient was  provided an opportunity to ask questions and all were answered. The patient agreed with the plan and demonstrated an understanding of the instructions.   The patient was advised to call back or seek an in-person evaluation if the symptoms worsen or if the condition fails to improve as anticipated.  I spent 25 minutes dedicated to the care of this patient, including pre-visit review of records, face to face time, post-visit ordering of testing and  documentation.   Vikki Ports, MD

## 2020-08-18 ENCOUNTER — Telehealth: Payer: Self-pay

## 2020-08-18 MED ORDER — BENZONATATE 200 MG PO CAPS
200.0000 mg | ORAL_CAPSULE | Freq: Three times a day (TID) | ORAL | 0 refills | Status: DC | PRN
Start: 2020-08-18 — End: 2020-12-21

## 2020-08-18 NOTE — Telephone Encounter (Signed)
Pt. Called back stating that he is not doing better stating you said you could call in some tessalon perles for him. I did see that in the notes from his virtual apt. On 08/16/20. He also stated he wondered why he had so much mucous and drainage has never had this much mucous before and what could be causing it.

## 2020-08-18 NOTE — Telephone Encounter (Signed)
Pt. Called back stating he tested for covid again today and that was also negative. He sated he is heading to the pharmacy now to pick up the prescription and will call back Monday if no improvement.

## 2020-08-18 NOTE — Telephone Encounter (Signed)
Called pt. LM with info and told to call back if needed.

## 2020-08-18 NOTE — Telephone Encounter (Signed)
Advise pt that I sent tessalon to his [facility].  He had been asked to repeat COVID test (since he tested on the early side), and let us know the results.  I suspect a virus as the cause.  If the mucus/phlegm isn't getting lighter and improving by Monday, to let us know, and we can start an antibiotic then.  Continue the supportive measures we discussed at visit, and remind him that he can see the AVS through MyChart

## 2020-08-21 ENCOUNTER — Telehealth: Payer: Self-pay | Admitting: *Deleted

## 2020-08-21 DIAGNOSIS — R058 Other specified cough: Secondary | ICD-10-CM

## 2020-08-21 MED ORDER — DOXYCYCLINE HYCLATE 100 MG PO TABS: 100.0000 mg | ORAL_TABLET | Freq: Two times a day (BID) | ORAL | 0 refills | Status: DC

## 2020-08-21 NOTE — Telephone Encounter (Signed)
Patient advised.

## 2020-08-21 NOTE — Telephone Encounter (Signed)
Advise pt I sent in rx for doxycycline.  He needs to use sunscreen to avoid burn/rash. Be sure that he is also using Mucinex DM as recommended (can use along with the tessalon)

## 2020-08-21 NOTE — Telephone Encounter (Signed)
Patient called and he is not feeling better. Patient is still coughing, still coughing up dark brown mucus. Blowing the same thing out of his nose. Did another home covid test which was negative, Friday-said it was his 4th one. No fevers. No chills, some body aches from all the coughing. Taking the cough perles but is having some trouble sleeping from the cough.  Patient uses [facility].

## 2020-08-21 NOTE — Telephone Encounter (Signed)
Patient advised, he mentioned when I called him back that his eyes had seeping pus in the mornings and his eyes are glued shut x the last 3 days. He wants to know if the abx will help with this? Or any other recommendation?

## 2020-08-21 NOTE — Telephone Encounter (Signed)
This actually suggests more viral etiology.  He should use moisturizing eye drops (and or allergy drops if he is having any itchy eyes). He needs to let us know if the surrounding area of the eyes are swollen or red.

## 2020-08-29 DIAGNOSIS — D1801 Hemangioma of skin and subcutaneous tissue: Secondary | ICD-10-CM | POA: Diagnosis not present

## 2020-08-29 DIAGNOSIS — D485 Neoplasm of uncertain behavior of skin: Secondary | ICD-10-CM | POA: Diagnosis not present

## 2020-08-29 DIAGNOSIS — D225 Melanocytic nevi of trunk: Secondary | ICD-10-CM | POA: Diagnosis not present

## 2020-08-29 DIAGNOSIS — L821 Other seborrheic keratosis: Secondary | ICD-10-CM | POA: Diagnosis not present

## 2020-08-29 DIAGNOSIS — D0462 Carcinoma in situ of skin of left upper limb, including shoulder: Secondary | ICD-10-CM | POA: Diagnosis not present

## 2020-08-29 DIAGNOSIS — Z85828 Personal history of other malignant neoplasm of skin: Secondary | ICD-10-CM | POA: Diagnosis not present

## 2020-08-29 DIAGNOSIS — D0461 Carcinoma in situ of skin of right upper limb, including shoulder: Secondary | ICD-10-CM | POA: Diagnosis not present

## 2020-08-29 DIAGNOSIS — L570 Actinic keratosis: Secondary | ICD-10-CM | POA: Diagnosis not present

## 2020-09-05 ENCOUNTER — Encounter: Payer: Self-pay | Admitting: Family Medicine

## 2020-09-11 ENCOUNTER — Encounter: Payer: Self-pay | Admitting: *Deleted

## 2020-09-11 ENCOUNTER — Telehealth: Payer: Self-pay | Admitting: *Deleted

## 2020-09-11 NOTE — Telephone Encounter (Signed)
Had to r/s pt's AWV 06/20/21 due to your trip. Do not have any am appt's available. He took a 1:45pm AWV on 07/05/21 and scheduled a 07/04/21 lab visit - will need future orders. He does have med check in Aug and is seeing [facility] in July so, not sure if you can even order right now-just didn't want to this slip slip through the cracks next year.

## 2020-09-12 NOTE — Telephone Encounter (Signed)
Made a sticky note.   Will discuss with him at next visit his options (coming that morning, vs prior to visit vs after visit for labs), and enter future orders at that time, if needed

## 2020-12-20 NOTE — Progress Notes (Signed)
Chief Complaint  Patient presents with   Prediabetes    Nonfasting med check. Has a place on his right calf that has been bothering him he would for you to look at. Great toe on his right foot, end of toe has been hurting-even at night if the sheet rubs against it. Has a cyst on right hand that he noticed Monday.    Coronary Artery Disease    Patient presents for 6 month follow-up on chronic problems. He established care with the [facility] last month.  R calf--was weed-eating last week, wearing long pants.  Something hit his leg when edging. He noticed a red area, was very itchy, not painful.  He used hydrocortisone cream and it has faded, improved.  Great toe on R--having pain at the end of the nail, wondering if it is an ingrown nail.  It isn't always painful, comes and goes.  He has injured the toe, and the corner of the nail had broken off (he didn't cut it that way). Denies any drainage.  Cyst R hand, noted earlier this week. Pharmacist told him it was likely a ganglion cyst.  It doesn't hurt.  Hypertension.  He is compliant with taking lisinopril daily. Some cough related to PND (which was present prior to starting the ACEI), and sometimes related to what he eats (ie popcorn, peanuts). He has been trying to limit the sodium in his diet.  BP's are running 100-112/65-low 70's.  Denies headaches, dizziness, chest pain, palpitations, edema.   BP Readings from Last 3 Encounters:  08/16/20 116/67  06/08/20 130/80  01/14/20 122/74    Chronic/intermittent cough/congestion--some days doesn't have a problem, other days has coughing spells 2x/day, this isn't frequent.  He uses Flonase every night, which has improved symptoms. Sometimes he gets the tickle in his throat for no reason, when reclined. This typically occurs after eating. Sometimes gets up phlegm (clear or yellow), has some sinus drainage, usually clear (occasionally yellow). It was felt that while there was a component of PND, there may  also have been a component of GERD contributing to his cough/symptoms. We had discussed possibly doing a 2 week trial of either Pepcid or Prilosec or Nexium with dinner or at bedtime. He reports he tried all of these, didn't really make a difference.  He doesn't think Mucinex helped much for this either, hasn't used in a while.  Hypercholesterolemia:  LDL went up from 97 to 144 in 2020, down to 108 in 05/2019, 130 in 05/2020.  He continues to try and limit cholesterol in his diet. Red meat once or twice a week, 2 eggs/week. +pimiento cheese and low sugar ice cream daily. Rarely has creamy soups/sauces/dressings. Denies changes to his diet since last visit. Lab Results  Component Value Date   CHOL 216 (H) 06/08/2020   HDL 78 06/08/2020   LDLCALC 130 (H) 06/08/2020   TRIG 45 06/08/2020   CHOLHDL 2.8 06/08/2020    Impaired fasting glucose: he doesn't check sugars.  He tries to limit sugar in his diet (candy, sweets, no regular sodas or sweet tea--very rare).   He eats sugar-free ice cream (most of the time).  Alcohol--average of 1 drink per day now, maybe 2-3 on a weekend, total of up to 10/week. A1c was normal at 5/5 in 05/2019, but up to 6% in 05/2020. Lab Results  Component Value Date   HGBA1C 6.0 (H) 06/08/2020      Prostate Cancer:  He is s/p brachytherapy 09/2014.  He last saw  urologist in 12/2019, doing well. He denies any significant urinary complaints (slight hesitancy and weakened stream, unchanged). He has ED and recently noted very little ejaculate.   PMH, PSH, SH reviewed  Outpatient Encounter Medications as of 12/21/2020  Medication Sig   fluticasone (FLONASE) 50 MCG/ACT nasal spray Use 2 spray(s) in each nostril once daily   lisinopril (ZESTRIL) 10 MG tablet Take 1 tablet (10 mg total) by mouth daily.   Multiple Vitamins-Minerals (MULTIVITAMIN WITH MINERALS) tablet Take 1 tablet by mouth daily.   Omega-3 Fatty Acids (FISH OIL) 1200 MG CAPS Take 1 capsule by mouth daily.   vitamin  C (ASCORBIC ACID) 500 MG tablet Take 500 mg by mouth daily.   [DISCONTINUED] benzonatate (TESSALON) 200 MG capsule Take 1 capsule (200 mg total) by mouth 3 (three) times daily as needed for cough.   [DISCONTINUED] doxycycline (VIBRA-TABS) 100 MG tablet Take 1 tablet (100 mg total) by mouth 2 (two) times daily.   No facility-administered encounter medications on file as of 12/21/2020.   Allergies  Allergen Reactions   Augmentin [Amoxicillin-Pot Clavulanate] Swelling   Ciprofloxacin Hcl Other (See Comments)    Stated reddened skin   Ibuprofen Itching   Penicillins     Pruritis, joint pains,throat swelling when he took Augmentin   Sulfa Antibiotics Rash    ROS: Denies fever, chills, headaches, dizziness, shortness of breath, chest pain.  Congestion, PND and cough per HPI. Denies nausea, vomiting, bowel changes, urinary complaints, bleeding, bruising. Skin concerns per HPI. Low volume ejaculate recently noted, (will discuss with urologist, upcoming appt). See HPI   PHYSICAL EXAM:  BP 110/70   Pulse 72   Ht $R'5\' 3"'vZ$  (1.6 m)   Wt 123 lb 12.8 oz (56.2 kg)   BMI 21.93 kg/m   Wt Readings from Last 3 Encounters:  12/21/20 123 lb 12.8 oz (56.2 kg)  08/16/20 128 lb (58.1 kg)  06/08/20 127 lb (57.6 kg)    Well developed, well nourished patient, in no distress, in good spirits HEENT: conjunctiva and sclera are clear, EOMI.  Wearing mask Neck: No lymphadenopathy or thyromegaly, no carotid bruit Heart:  Regular rate and rhythm, no murmur, rub, gallop or ectopy Lungs:  Clear bilaterally, without wheezes, rales or ronchi Abdomen:  Soft, nontender, nondistended, no hepatosplenomegaly or masses, normal bowel sounds Extremities:  No clubbing, cyanosis or edema, 2+ pulses.  Neuro:  Alert and oriented x 3. Normal strength, gait Back:  No spine or CVA tenderness Psych:  Normal mood, affect, hygiene and grooming, normal speech, eye contact Skin: 85mm mobile, soft, nontender subcutaneous cyst  overlying the dorsal aspect of the proximal R 5th metacarpal. R great toenail--medial aspect is cut at angle (broken that way, not cut).  There is some thickening of the distal skin.  No erythema, warmth, nontender today. Right medial calf--small abrasion and raised, red papule.  Small abrasion at distal shin as well. No crusting or significant STS. No streaks or warmth.  Lab Results  Component Value Date   HGBA1C 5.7 (A) 12/21/2020    ASSESSMENT/PLAN:   IFG (impaired fasting glucose) - cont low carb, low sugar diet, regular exercise - Plan: HgB A1c  Pure hypercholesterolemia - reviewed low cholesterol diet  Essential hypertension, benign - controlled  Chronic non-seasonal allergic rhinitis - cont flonase, discussed Mucinex prn  Ganglion cyst - of R hand.  Educated, asymptomatic, no treatment needed at this time  Pain of right great toe - suspect mild intermittent ingrowing sensation. Currently without infection, nontender. Soak,  advised how to properly clip nails, and s/sx infection.  Rash - R shin--some due to abrasion, and suspect either insect bite or contact derm. no e/o infection, cont HC BID prn   ENTER FUTURE ORDERS FOR 05/2021--see if any labs to be done at [place]. Needed labs will be cbc, c-met, A1c, lipid

## 2020-12-21 ENCOUNTER — Other Ambulatory Visit: Payer: Self-pay

## 2020-12-21 ENCOUNTER — Ambulatory Visit (INDEPENDENT_AMBULATORY_CARE_PROVIDER_SITE_OTHER): Payer: Medicare HMO | Admitting: Family Medicine

## 2020-12-21 ENCOUNTER — Encounter: Payer: Self-pay | Admitting: Family Medicine

## 2020-12-21 VITALS — BP 110/70 | HR 72 | Ht 63.0 in | Wt 123.8 lb

## 2020-12-21 DIAGNOSIS — M79674 Pain in right toe(s): Secondary | ICD-10-CM | POA: Diagnosis not present

## 2020-12-21 DIAGNOSIS — I10 Essential (primary) hypertension: Secondary | ICD-10-CM | POA: Diagnosis not present

## 2020-12-21 DIAGNOSIS — M6740 Ganglion, unspecified site: Secondary | ICD-10-CM | POA: Diagnosis not present

## 2020-12-21 DIAGNOSIS — R21 Rash and other nonspecific skin eruption: Secondary | ICD-10-CM

## 2020-12-21 DIAGNOSIS — E7800 Pure hypercholesterolemia, unspecified: Secondary | ICD-10-CM

## 2020-12-21 DIAGNOSIS — R7301 Impaired fasting glucose: Secondary | ICD-10-CM

## 2020-12-21 DIAGNOSIS — Z5181 Encounter for therapeutic drug level monitoring: Secondary | ICD-10-CM

## 2020-12-21 DIAGNOSIS — J3089 Other allergic rhinitis: Secondary | ICD-10-CM

## 2020-12-21 LAB — POCT GLYCOSYLATED HEMOGLOBIN (HGB A1C): Hemoglobin A1C: 5.7 % — AB (ref 4.0–5.6)

## 2020-12-21 NOTE — Patient Instructions (Addendum)
Soak the great toe once or twice daily.  That should help with any discomfort. Be sure to cut the nails straight across. Currently the lower edge of the nail is much lower--be sure to help train the edge of the nail over the skin as it grows out. Currently there is no evidence of any infection. Return if it becomes red, painful, or draining pus.  Continue hydrocortisone cream twice daily if needed for itchy rash on the lower leg.  Continue to be careful and wear long clothing when in the yard.  Continue yearly high dose flu shots (fine to get at [facility] like you always do). Pay attention to the news--I recommend getting the updated COVID vaccine (with better coverage for the variants) when it becomes available.  Continue to limit sugar, sweets, and carbs in your diet, and get regular exercise, to help keep the sugars down.  Continue to follow a lowfat, low cholesterol diet.

## 2021-01-19 ENCOUNTER — Other Ambulatory Visit (HOSPITAL_BASED_OUTPATIENT_CLINIC_OR_DEPARTMENT_OTHER): Payer: Self-pay

## 2021-01-19 ENCOUNTER — Ambulatory Visit: Payer: Medicare HMO | Attending: Internal Medicine

## 2021-01-19 DIAGNOSIS — C61 Malignant neoplasm of prostate: Secondary | ICD-10-CM | POA: Diagnosis not present

## 2021-01-19 DIAGNOSIS — Z8546 Personal history of malignant neoplasm of prostate: Secondary | ICD-10-CM | POA: Diagnosis not present

## 2021-01-19 DIAGNOSIS — Z23 Encounter for immunization: Secondary | ICD-10-CM

## 2021-01-19 MED ORDER — PFIZER COVID-19 VAC BIVALENT 30 MCG/0.3ML IM SUSP
INTRAMUSCULAR | 0 refills | Status: DC
  Filled 2021-01-19: qty 0.3, 1d supply, fill #0

## 2021-01-19 MED ORDER — INFLUENZA VAC A&B SA ADJ QUAD 0.5 ML IM PRSY
PREFILLED_SYRINGE | INTRAMUSCULAR | 0 refills | Status: DC
  Filled 2021-01-19: qty 0.5, 1d supply, fill #0

## 2021-01-19 NOTE — Progress Notes (Signed)
   Covid-19 Vaccination Clinic  Name:  Gary Blair    MRN: [id number] DOB: 01/06/1951  01/19/2021  Gary Blair was observed post Covid-19 immunization for 15 minutes without incident. He was provided with Vaccine Information Sheet and instruction to access the V-Safe system.   Gary Blair was instructed to call 911 with any severe reactions post vaccine: Difficulty breathing  Swelling of face and throat  A fast heartbeat  A bad rash all over body  Dizziness and weakness

## 2021-01-22 ENCOUNTER — Other Ambulatory Visit (HOSPITAL_BASED_OUTPATIENT_CLINIC_OR_DEPARTMENT_OTHER): Payer: Self-pay

## 2021-02-19 ENCOUNTER — Telehealth (INDEPENDENT_AMBULATORY_CARE_PROVIDER_SITE_OTHER): Payer: Medicare HMO | Admitting: Family Medicine

## 2021-02-19 ENCOUNTER — Encounter: Payer: Self-pay | Admitting: Family Medicine

## 2021-02-19 VITALS — Temp 98.0°F | Ht 63.0 in | Wt 127.0 lb

## 2021-02-19 DIAGNOSIS — U071 COVID-19: Secondary | ICD-10-CM

## 2021-02-19 NOTE — Patient Instructions (Signed)
Your onset of symptoms was 10/20, considered "day 0".  You need to isolate for days 1-5 (10/21-25). On 10/26, if no fever and it respiratory symptoms are still minor, then you may leave isolation, but need to wear a mask 100% of the time through day 10; able to leave without a mask on 10/31  Stay well hydrated.  Continue mucinex if needed for cough. Tylenol as needed for any pain or fever.  Please seek re-evaluation if worsening cough, pain with breathing, recurrent fever, chest pain, shortness of breath, any neurologic symptoms, leg swelling or other new concerns.

## 2021-02-19 NOTE — Progress Notes (Signed)
Start time: 12:05.  I am unable to see or hear patient, but he sees/hears me. Audio obtained from telephone End time: 12:30  Virtual Visit via Video Note  I connected with Gary Blair on 02/19/21 by a video enabled telemedicine application and verified that I am speaking with the correct person using two identifiers.  Location: Patient: home Provider: office   I discussed the limitations of evaluation and management by telemedicine and the availability of in person appointments. The patient expressed understanding and agreed to proceed.  History of Present Illness:  Chief Complaint  Patient presents with   Covid Positive    VIRTUAL positive covid test Originally tested positive on Sat and again today. Symptoms began late Thursday evening has some drainage. Friday just didn't feel right. Saturday ST and cough started. No fever.    Thursday 10/20 symptoms started with some drainage (postnasal), then developed sore throat and a cough on Friday. It got worse on Saturday.  Yesterday they improved. COVID test was + on Saturday morning, Saturday afternoon.  Took another one yesterday morning, and this morning, all positive.  Cough has been productive, clear (slightly yellow).  No associated shortness of breath.  Today he doesn't seem to have as much of the tickle in his throat. Denies any sinus pain. Mucus from the nose has been clear.  Had some chest pain, thought it was related to coughing, is improving. Pain was in the upper left chest, denies any now.  Might feel a little pulling with certain positions, feels muscular.  Denies any fever. No known sick contacts.  Currently he doesn't feel bad--felt much better this morning than he has the last few days.  He has been taking Mucinex DM twice daily (not yet today).  Denies much cough today.  Home with wife, not wearing mask, she is not yet sick  Had COVID vaccines x 5 (bivalent booster on 01/19/21)  PMH, PSH, SH  reviewed  Outpatient Encounter Medications as of 02/19/2021  Medication Sig Note   dextromethorphan-guaiFENesin (MUCINEX DM) 30-600 MG 12hr tablet Take 1 tablet by mouth 2 (two) times daily. 02/19/2021: Last dose yesterday am   fluticasone (FLONASE) 50 MCG/ACT nasal spray Use 2 spray(s) in each nostril once daily    lisinopril (ZESTRIL) 10 MG tablet Take 1 tablet (10 mg total) by mouth daily.    Multiple Vitamins-Minerals (MULTIVITAMIN WITH MINERALS) tablet Take 1 tablet by mouth daily.    Omega-3 Fatty Acids (FISH OIL) 1200 MG CAPS Take 1 capsule by mouth daily.    vitamin C (ASCORBIC ACID) 500 MG tablet Take 500 mg by mouth daily.    [DISCONTINUED] COVID-19 mRNA bivalent vaccine, Pfizer, (PFIZER COVID-19 VAC BIVALENT) injection Inject into the muscle.    [DISCONTINUED] influenza vaccine adjuvanted (FLUAD) 0.5 ML injection Inject into the muscle.    No facility-administered encounter medications on file as of 02/19/2021.   Allergies  Allergen Reactions   Augmentin [Amoxicillin-Pot Clavulanate] Swelling   Ciprofloxacin Hcl Other (See Comments)    Stated reddened skin   Ibuprofen Itching   Penicillins     Pruritis, joint pains,throat swelling when he took Augmentin   Sulfa Antibiotics Rash    ROS:  No n/v/d, no fever/chills, had some body aches, mild and resolved. Denies headaches. No bleeding, bruising, rash or urinary complaints. Mild residual cough, no shortness of breath.  Sore throat resolved. See HPI    Observations/Objective:  Temp 98 F (36.7 C) (Oral)   Ht 5\' 3"  (1.6 m)  Wt 127 lb (57.6 kg)   BMI 22.50 kg/m   Patient is alert and oriented. Speaking normally, doesn't sound congested, no throat-clearing or coughing during visit. Exam is limited due to virtual nature of the visit  Assessment and Plan:  COVID-19 virus infection - very mild symptoms, already improving. Discussed antivirals, decided against given his very mild sx, vaccines x 5, and overall good health  (HTN, age as risks)  Your onset of symptoms was 10/20, considered "day 0".  You need to isolate for days 1-5 (10/21-25). On 10/26, if no fever and it respiratory symptoms are still minor, then you may leave isolation, but need to wear a mask 100% of the time through day 10; able to leave without a mask on 10/31  Stay well hydrated.  Continue mucinex if needed for cough. Tylenol as needed for any pain or fever.  Please seek re-evaluation if worsening cough, pain with breathing, recurrent fever, chest pain, shortness of breath, any neurologic symptoms, leg swelling or other new concerns.   Follow Up Instructions:    I discussed the assessment and treatment plan with the patient. The patient was provided an opportunity to ask questions and all were answered. The patient agreed with the plan and demonstrated an understanding of the instructions.   The patient was advised to call back or seek an in-person evaluation if the symptoms worsen or if the condition fails to improve as anticipated.  I spent 27 minutes dedicated to the care of this patient, including pre-visit review of records, face to face time, post-visit ordering of testing and documentation.    Vikki Ports, MD

## 2021-04-13 DIAGNOSIS — H52203 Unspecified astigmatism, bilateral: Secondary | ICD-10-CM | POA: Diagnosis not present

## 2021-04-13 DIAGNOSIS — H35371 Puckering of macula, right eye: Secondary | ICD-10-CM | POA: Diagnosis not present

## 2021-04-13 DIAGNOSIS — H43813 Vitreous degeneration, bilateral: Secondary | ICD-10-CM | POA: Diagnosis not present

## 2021-04-13 DIAGNOSIS — H2513 Age-related nuclear cataract, bilateral: Secondary | ICD-10-CM | POA: Diagnosis not present

## 2021-06-20 ENCOUNTER — Ambulatory Visit: Payer: Medicare HMO | Admitting: Family Medicine

## 2021-07-04 ENCOUNTER — Other Ambulatory Visit: Payer: Medicare HMO

## 2021-07-04 ENCOUNTER — Other Ambulatory Visit: Payer: Self-pay

## 2021-07-04 DIAGNOSIS — R7301 Impaired fasting glucose: Secondary | ICD-10-CM | POA: Diagnosis not present

## 2021-07-04 DIAGNOSIS — Z5181 Encounter for therapeutic drug level monitoring: Secondary | ICD-10-CM | POA: Diagnosis not present

## 2021-07-04 DIAGNOSIS — I10 Essential (primary) hypertension: Secondary | ICD-10-CM | POA: Diagnosis not present

## 2021-07-04 DIAGNOSIS — E7800 Pure hypercholesterolemia, unspecified: Secondary | ICD-10-CM

## 2021-07-04 LAB — LIPID PANEL

## 2021-07-04 NOTE — Progress Notes (Signed)
Chief Complaint  Patient presents with   Medicare Wellness    Nonfasting (labs already done) AWV/CPE. Still states he is having some intermittent chest pains, that he has mentioned to you in the past. And sometimes gets dizzy at the gym, very seldom and usually when getting up. No other concerns.    Gary Blair is a 71 y.o. male who presents for annual physical exam, Medicare wellness visit and follow-up on chronic medical conditions.  See below for labs done prior to his visit. He has the following concerns:  Hypertension.  He is compliant with taking lisinopril daily. Some cough related to PND (which was present prior to starting the ACEI), and sometimes related to what he eats (ie popcorn, peanuts). This has improved some. He has been trying to limit the sodium in his diet. BP was 106/62 at the pharmacy this morning.  It is usually 105-110/60's at the pharmacy. Checks it once or twice a week, at 2 different places, and are all consistently low.   Denies headaches,  palpitations, edema, shortness of breath. He reports some dizziness with position changes, only noticed at the gym. He reports staying well hydrated at the gym. He has some chest pain primarily at night, while trying to go to sleep.  No exertional chest pain.  Not sore to touch.  Denies any heartburn.  This occurs 1-2x/week, he usually falls asleep with it (not severe), probably lasts 5-10 minutes.  3/10 pain. He has lost some friends from heart disease recently, and were healthy, exercising.  BP Readings from Last 3 Encounters:  07/05/21 136/62  12/21/20 110/70  08/16/20 116/67   Chronic/intermittent cough/congestion--some days doesn't have a problem, other days has coughing spells 2x/day, this isn't frequent.  He uses Flonase every night, which has improved symptoms. He gets a coughing spell about 3x/week. Sometimes related to eating peanuts too fast, feels like something gets stuck in his throat. In the morning he gets  up clear mucus/phlegm.  He doesn't really find this too bothersome today.  Hypercholesterolemia:  LDL went up from 97 to 144 in 2020, down to 108 in 05/2019, 130 in 05/2020.  He continues to try and limit cholesterol in his diet. Red meat once or twice a week, 2 eggs/week. +pimiento cheese and low sugar ice cream daily. Rarely has creamy soups/sauces/dressings. Not much butter, no fried foods. Denies changes to his diet since last visit.  Impaired fasting glucose: he doesn't check sugars.  He tries to limit sugar in his diet (candy, sweets, no regular sodas or sweet tea--very rare).   He eats sugar-free ice cream (most of the time).  Alcohol--average of 1 drink per day (usually beer, occasionally a shot of bourbon), maybe 2-3 on a weekend, total of up to 10-12/week. A1c was normal at 5/5 in 05/2019, but up to 6% in 05/2020.  Prostate Cancer:  He is s/p brachytherapy 09/2014.  He last saw urologist in 12/2020, doing well, PSA remained low. He denies any significant urinary complaints (slight hesitancy and weakened stream, unchanged). Up 0-1 times/night to void. He has ED and continues to note very little ejaculate.  Immunization History  Administered Date(s) Administered   Influenza Split 03/16/2005, 02/15/2006, 02/01/2007, 02/25/2008, 04/20/2009, 01/28/2012   Influenza Whole 02/09/2013   Influenza, High Dose Seasonal PF 01/23/2016, 01/27/2017, 01/04/2018, 12/20/2018, 12/29/2019   Influenza,trivalent, recombinat, inj, PF 01/08/2014   Influenza-Unspecified 01/01/2015, 01/04/2018, 12/28/2020   PFIZER(Purple Top)SARS-COV-2 Vaccination 06/05/2019, 06/30/2019, 02/01/2020, 09/04/2020   Pfizer Covid-19 Vaccine Bivalent Booster 59yr &  up 01/19/2021   Pneumococcal Conjugate-13 04/11/2014   Pneumococcal Polysaccharide-23 12/14/2004, 04/17/2015   Td 06/27/1989, 09/30/2001   Tdap 05/20/2015   Zoster Recombinat (Shingrix) 05/30/2020, 07/29/2020   Zoster, Live 04/26/2011   Last colonoscopy: 06/2017 Dr. Erlene Quan  [facility]); we did not receive final report (only "technical component").  He was told to f/u in 5 years. Had tubular adenoma in 03/2014. Last PSA: per urologist, UTD (12/2020, result not received) Dentist: twice yearly   Ophtho: yearly Exercise:  Going to [employer] 4-5x/week (cardio x 30-50 mins, plus weight machines)  Patient Care Team: Rita Ohara, MD as PCP - General (Family Medicine) Dr. Salli Real as Consulting Physician (Dentistry) Marygrace Drought, MD as Consulting Physician (Ophthalmology) Franchot Gallo, MD as Consulting Physician (Urology) Thurman Coyer, DO as Consulting Physician (Sports Medicine) Ulla Gallo, MD as Consulting Physician (Dermatology) Dentist: Dr. Imagene Riches GI:  Dr. Erlene Quan ([place], in [place], retired) (Radiation onc: Dr. Valere Dross)  Depression Screening: [place] Office Visit from 07/05/2021 in [place]  PHQ-2 Total Score 0        Falls screen:  Fall Risk  07/05/2021 12/21/2020 06/08/2020 12/06/2019 05/31/2019  Falls in the past year? 0 0 0 0 0  Number falls in past yr: 0 0 - - -  Injury with Fall? 0 0 - - -  Risk for fall due to : No Fall Risks No Fall Risks - - -  Follow up Falls evaluation completed Falls evaluation completed - - -     Functional Status Survey: Is the patient deaf or have difficulty hearing?: No Does the patient have difficulty seeing, even when wearing glasses/contacts?: No Does the patient have difficulty concentrating, remembering, or making decisions?: No Does the patient have difficulty walking or climbing stairs?: No Does the patient have difficulty dressing or bathing?: No Does the patient have difficulty doing errands alone such as visiting a doctor's office or shopping?: No  Mini-Cog Scoring: 5   End of Life Discussion:  Patient has a living will and medical power of attorney (and brought in 03/2015)    PMH, [place], [place] and FH were reviewed and updated  Outpatient Encounter Medications as of  07/05/2021  Medication Sig Note   fluticasone (FLONASE) 50 MCG/ACT nasal spray Use 2 spray(s) in each nostril once daily    lisinopril (ZESTRIL) 10 MG tablet Take 1 tablet (10 mg total) by mouth daily.    Multiple Vitamins-Minerals (MULTIVITAMIN WITH MINERALS) tablet Take 1 tablet by mouth daily.    Omega-3 Fatty Acids (FISH OIL) 1200 MG CAPS Take 1 capsule by mouth daily.    vitamin C (ASCORBIC ACID) 500 MG tablet Take 500 mg by mouth daily.    [DISCONTINUED] dextromethorphan-guaiFENesin (MUCINEX DM) 30-600 MG 12hr tablet Take 1 tablet by mouth 2 (two) times daily. 02/19/2021: Last dose yesterday am   No facility-administered encounter medications on file as of 07/05/2021.   Allergies  Allergen Reactions   Augmentin [Amoxicillin-Pot Clavulanate] Swelling   Ciprofloxacin Hcl Other (See Comments)    Stated reddened skin   Ibuprofen Itching   Penicillins     Pruritis, joint pains,throat swelling when he took Augmentin   Sulfa Antibiotics Rash    ROS: The patient denies fever, chills, Denies headaches, vision loss, ear pain, palpitations, dizziness, syncope, dyspnea on exertion, swelling, constipation, melena, hematochezia, hematuria, genital lesions, weakness, tremor, suspicious skin lesions (sees derm regularly); denies depression, anxiety, abnormal bleeding/bruising, or enlarged lymph nodes.   He has carpal tunnel on the left,  wears brace to sleep at night, which helps. He has some back stiffness in the morning, only rare/minor knee pain.  Has some pain in his wrists, which he says is arthritis (right) Chronic PND improved with Flonase  Some hesitancy and weakened stream, unchanged.  No nocturia or incontinence. +ED.  Ear plugging occasionally, mainly on the R. Chest pain as per HPI, noted last year as well, perhaps a little more frequently now. Denies any heartburn. He gets mild discomfort in lower abdomen, moves around, occasional.   PHYSICAL EXAM:  BP 136/62    Pulse 80    Ht 5'  3" (1.6 m)    Wt 128 lb (58.1 kg)    BMI 22.67 kg/m   146/70 on repeat by MD Recheck later in visit by nurse was 122/60  Wt Readings from Last 3 Encounters:  07/05/21 128 lb (58.1 kg)  02/19/21 127 lb (57.6 kg)  12/21/20 123 lb 12.8 oz (56.2 kg)     General Appearance:    Alert, cooperative, no distress, appears stated age     Head:    Normocephalic, without obvious abnormality, atraumatic     Eyes:    PERRL, conjunctiva/corneas clear, EOM's intact, fundi benign     Ears:    Normal TM's and external ear canals    Nose:    Not examined, wearing mask  Throat:    Not examined, wearing mask  Neck:    Supple, no lymphadenopathy; thyroid: no enlargement/tenderness/nodules; no carotid bruit or JVD.    Back:    Spine nontender, no curvature, ROM normal, no CVA tenderness     Lungs:    Clear to auscultation bilaterally without wheezes, rales or ronchi; respirations unlabored     Chest Wall:    No tenderness or deformity     Heart:    Regular rate and rhythm, S1 and S2 normal, no murmur, rub or gallop     Breast Exam:    No chest wall tenderness, masses or gynecomastia     Abdomen:    Soft, non-tender, nondistended, normoactive bowel sounds, no masses, no hepatosplenomegaly.   Genitalia:    Deferred to urologist  Rectal:    Deferred to urologist.   Extremities:    No clubbing, cyanosis or edema.  Pulses:    2+ and symmetric all extremities     Skin:    Skin color, texture, turgor normal, no rashes.  Lymph nodes:    Cervical, supraclavicular, and inguinal nodes normal     Neurologic:    Normal strength, sensation and gait; reflexes 2+ and symmetric throughout                        Psych:   Normal mood, affect, hygiene and grooming   Lab Results  Component Value Date   HGBA1C 5.8 (H) 07/04/2021   Fasting glucose 101    Chemistry      Component Value Date/Time   NA 143 07/04/2021 0836   K 4.9 07/04/2021 0836   CL 105 07/04/2021 0836   CO2 21 07/04/2021 0836   BUN 15 07/04/2021  0836   CREATININE 1.16 07/04/2021 0836   CREATININE 0.96 04/17/2016 0844      Component Value Date/Time   CALCIUM 9.4 07/04/2021 0836   ALKPHOS 67 07/04/2021 0836   AST 25 07/04/2021 0836   ALT 19 07/04/2021 0836   BILITOT 0.8 07/04/2021 0836     Lab Results  Component Value Date  WBC 4.0 07/04/2021   HGB 15.5 07/04/2021   HCT 45.7 07/04/2021   MCV 95 07/04/2021   PLT 198 07/04/2021   Lab Results  Component Value Date   CHOL 221 (H) 07/04/2021   HDL 76 07/04/2021   LDLCALC 136 (H) 07/04/2021   TRIG 53 07/04/2021   CHOLHDL 2.9 07/04/2021   EKG: NSR, no acute abnl   ASSESSMENT/PLAN:  Annual physical exam  Medicare annual wellness visit, subsequent  IFG (impaired fasting glucose) - reviewed proper diet, continue reg exercise and healthy weight  Pure hypercholesterolemia - intermed ASCVD risk (was high with initial BP); check CT calcium scan, consider statin. Low chol diet reviewed - Plan: CT CARDIAC SCORING (SELF PAY ONLY)  Essential hypertension, benign - well controlled, on lower end with some dizziness.  Decrease dose to '5mg'$  lisinopril - Plan: CT CARDIAC SCORING (SELF PAY ONLY), lisinopril (ZESTRIL) 5 MG tablet  Atypical chest pain - sounds non-cardiac, but given risk factors, rec further eval with CT calcium scan - Plan: CT CARDIAC SCORING (SELF PAY ONLY), EKG 12-Lead  Family history of early CAD - Plan: CT CARDIAC SCORING (SELF PAY ONLY)  Chronic non-seasonal allergic rhinitis - continue flonase, reviewed proper technique. Recommended adding oral antihistamine - Plan: fluticasone (FLONASE) 50 MCG/ACT nasal spray  23.2% 10 year ASCVD risk (high risk) based on initial BP by nurse. Was 15.6% (intermediate risk) when using BP from pharmacy earlier today.  Lower lisinopril to '5mg'$ , cont monitoring. F/u 6 weeks   Recommended at least 30 minutes of aerobic activity at least 5 days/week, weight-bearing exercise at least 2x/week; proper sunscreen use reviewed; healthy  diet and alcohol recommendations (less than or equal to 2 drinks/day) reviewed; regular seatbelt use; changing batteries in smoke detectors. Immunization recommendations discussed--continue yearly flu shots. Colonoscopy recommendations reviewed, UTD, due again 2024 (5 year follow-up).  MOST form reviewed and signed. Full Code, Full Care.  F/u 1 year for CPE/AWV   Medicare Attestation I have personally reviewed: The patient's medical and social history Their use of alcohol, tobacco or illicit drugs Their current medications and supplements The patient's functional ability including ADLs,fall risks, home safety risks, cognitive, and hearing and visual impairment Diet and physical activities Evidence for depression or mood disorders  The patient's weight, height, BMI have been recorded in the chart.  I have made referrals, counseling, and provided education to the patient based on review of the above and I have provided the patient with a written personalized care plan for preventive services.

## 2021-07-04 NOTE — Patient Instructions (Addendum)
?  HEALTH MAINTENANCE RECOMMENDATIONS: ? ?It is recommended that you get at least 30 minutes of aerobic exercise at least 5 days/week (for weight loss, you may need as much as 60-90 minutes). This can be any activity that gets your heart rate up. This can be divided in 10-15 minute intervals if needed, but try and build up your endurance at least once a week.  Weight bearing exercise is also recommended twice weekly. ? ?Eat a healthy diet with lots of vegetables, fruits and fiber.  "Colorful" foods have a lot of vitamins (ie green vegetables, tomatoes, red peppers, etc).  Limit sweet tea, regular sodas and alcoholic beverages, all of which has a lot of calories and sugar.  Up to 2 alcoholic drinks daily may be beneficial for men (unless trying to lose weight, watch sugars).  Drink a lot of water. ? ?Sunscreen of at least SPF 30 should be used on all sun-exposed parts of the skin when outside between the hours of 10 am and 4 pm (not just when at beach or pool, but even with exercise, golf, tennis, and yard work!)  Use a sunscreen that says "broad spectrum" so it covers both UVA and UVB rays, and make sure to reapply every 1-2 hours. ? ?Remember to change the batteries in your smoke detectors when changing your clock times in the spring and fall.  Carbon monoxide detectors are recommended for your home. ? ?Use your seat belt every time you are in a car, and please drive safely and not be distracted with cell phones and texting while driving. ? ? ? ?Gary Blair , ?Thank you for taking time to come for your Medicare Wellness Visit. I appreciate your ongoing commitment to your health goals. Please review the following plan we discussed and let me know if I can assist you in the future.  ? ?This is a list of the screening recommended for you and due dates:  ?Health Maintenance  ?Topic Date Due  ? Colon Cancer Screening  07/15/2020  ? Flu Shot  11/27/2020  ? Tetanus Vaccine  05/19/2025  ? Pneumonia Vaccine  Completed  ?  COVID-19 Vaccine  Completed  ? Hepatitis C Screening: USPSTF Recommendation to screen - Ages 1-79 yo.  Completed  ? Zoster (Shingles) Vaccine  Completed  ? HPV Vaccine  Aged Out  ? ?Continue to get flu shots yearly. ?Pay attention to the news as far as any future COVID boosters. ? ?The above-date for colonoscopy may be wrong.  I believe that you said you were due for a 5 year follow-up colonoscopy, which would be in 06/2022. ? ?We are referring you for coronary calcium scan, as discussed. ? ?

## 2021-07-05 ENCOUNTER — Encounter: Payer: Self-pay | Admitting: Family Medicine

## 2021-07-05 ENCOUNTER — Ambulatory Visit (INDEPENDENT_AMBULATORY_CARE_PROVIDER_SITE_OTHER): Payer: Medicare HMO | Admitting: Family Medicine

## 2021-07-05 VITALS — BP 122/60 | HR 80 | Ht 63.0 in | Wt 128.0 lb

## 2021-07-05 DIAGNOSIS — R0789 Other chest pain: Secondary | ICD-10-CM | POA: Diagnosis not present

## 2021-07-05 DIAGNOSIS — E7800 Pure hypercholesterolemia, unspecified: Secondary | ICD-10-CM

## 2021-07-05 DIAGNOSIS — R7301 Impaired fasting glucose: Secondary | ICD-10-CM

## 2021-07-05 DIAGNOSIS — J3089 Other allergic rhinitis: Secondary | ICD-10-CM

## 2021-07-05 DIAGNOSIS — Z8249 Family history of ischemic heart disease and other diseases of the circulatory system: Secondary | ICD-10-CM | POA: Diagnosis not present

## 2021-07-05 DIAGNOSIS — Z0000 Encounter for general adult medical examination without abnormal findings: Secondary | ICD-10-CM

## 2021-07-05 DIAGNOSIS — I10 Essential (primary) hypertension: Secondary | ICD-10-CM

## 2021-07-05 DIAGNOSIS — D692 Other nonthrombocytopenic purpura: Secondary | ICD-10-CM

## 2021-07-05 LAB — COMPREHENSIVE METABOLIC PANEL
ALT: 19 IU/L (ref 0–44)
AST: 25 IU/L (ref 0–40)
Albumin/Globulin Ratio: 2.4 — ABNORMAL HIGH (ref 1.2–2.2)
Albumin: 4.7 g/dL (ref 3.7–4.7)
Alkaline Phosphatase: 67 IU/L (ref 44–121)
BUN/Creatinine Ratio: 13 (ref 10–24)
BUN: 15 mg/dL (ref 8–27)
Bilirubin Total: 0.8 mg/dL (ref 0.0–1.2)
CO2: 21 mmol/L (ref 20–29)
Calcium: 9.4 mg/dL (ref 8.6–10.2)
Chloride: 105 mmol/L (ref 96–106)
Creatinine, Ser: 1.16 mg/dL (ref 0.76–1.27)
Globulin, Total: 2 g/dL (ref 1.5–4.5)
Glucose: 101 mg/dL — ABNORMAL HIGH (ref 70–99)
Potassium: 4.9 mmol/L (ref 3.5–5.2)
Sodium: 143 mmol/L (ref 134–144)
Total Protein: 6.7 g/dL (ref 6.0–8.5)
eGFR: 67 mL/min/{1.73_m2} (ref >59)

## 2021-07-05 LAB — LIPID PANEL
Chol/HDL Ratio: 2.9 ratio (ref 0.0–5.0)
Cholesterol, Total: 221 mg/dL — ABNORMAL HIGH (ref 100–199)
HDL: 76 mg/dL (ref >39)
LDL Chol Calc (NIH): 136 mg/dL — ABNORMAL HIGH (ref 0–99)
Triglycerides: 53 mg/dL (ref 0–149)
VLDL Cholesterol Cal: 9 mg/dL (ref 5–40)

## 2021-07-05 LAB — CBC WITH DIFFERENTIAL/PLATELET
Basophils Absolute: 0 10*3/uL (ref 0.0–0.2)
Basos: 1 %
EOS (ABSOLUTE): 0.1 10*3/uL (ref 0.0–0.4)
Eos: 3 %
Hematocrit: 45.7 % (ref 37.5–51.0)
Hemoglobin: 15.5 g/dL (ref 13.0–17.7)
Immature Grans (Abs): 0 10*3/uL (ref 0.0–0.1)
Immature Granulocytes: 1 %
Lymphocytes Absolute: 1.5 10*3/uL (ref 0.7–3.1)
Lymphs: 38 %
MCH: 32.1 pg (ref 26.6–33.0)
MCHC: 33.9 g/dL (ref 31.5–35.7)
MCV: 95 fL (ref 79–97)
Monocytes Absolute: 0.6 10*3/uL (ref 0.1–0.9)
Monocytes: 14 %
Neutrophils Absolute: 1.8 10*3/uL (ref 1.4–7.0)
Neutrophils: 43 %
Platelets: 198 10*3/uL (ref 150–450)
RBC: 4.83 x10E6/uL (ref 4.14–5.80)
RDW: 12.3 % (ref 11.6–15.4)
WBC: 4 10*3/uL (ref 3.4–10.8)

## 2021-07-05 LAB — HEMOGLOBIN A1C
Est. average glucose Bld gHb Est-mCnc: 120 mg/dL
Hgb A1c MFr Bld: 5.8 % — ABNORMAL HIGH (ref 4.8–5.6)

## 2021-07-05 MED ORDER — LISINOPRIL 5 MG PO TABS: 5.0000 mg | ORAL_TABLET | Freq: Every day | ORAL | 0 refills | Status: DC

## 2021-07-05 MED ORDER — FLUTICASONE PROPIONATE 50 MCG/ACT NA SUSP: NASAL | 3 refills | Status: DC

## 2021-07-12 ENCOUNTER — Ambulatory Visit (HOSPITAL_BASED_OUTPATIENT_CLINIC_OR_DEPARTMENT_OTHER)
Admission: RE | Admit: 2021-07-12 | Discharge: 2021-07-12 | Disposition: A | Discharge status: Home / Self Care | Payer: Medicare HMO | Source: Ambulatory Visit | Attending: Family Medicine | Admitting: Family Medicine

## 2021-07-12 ENCOUNTER — Other Ambulatory Visit: Payer: Self-pay

## 2021-07-12 DIAGNOSIS — I10 Essential (primary) hypertension: Secondary | ICD-10-CM | POA: Insufficient documentation

## 2021-07-12 DIAGNOSIS — E7800 Pure hypercholesterolemia, unspecified: Secondary | ICD-10-CM | POA: Insufficient documentation

## 2021-07-12 DIAGNOSIS — R0789 Other chest pain: Secondary | ICD-10-CM | POA: Insufficient documentation

## 2021-07-12 DIAGNOSIS — Z8249 Family history of ischemic heart disease and other diseases of the circulatory system: Secondary | ICD-10-CM | POA: Insufficient documentation

## 2021-07-13 ENCOUNTER — Other Ambulatory Visit: Payer: Self-pay | Admitting: Family Medicine

## 2021-07-13 DIAGNOSIS — R079 Chest pain, unspecified: Secondary | ICD-10-CM

## 2021-07-13 DIAGNOSIS — R931 Abnormal findings on diagnostic imaging of heart and coronary circulation: Secondary | ICD-10-CM

## 2021-07-13 DIAGNOSIS — E7800 Pure hypercholesterolemia, unspecified: Secondary | ICD-10-CM

## 2021-07-13 DIAGNOSIS — Z5181 Encounter for therapeutic drug level monitoring: Secondary | ICD-10-CM

## 2021-07-13 DIAGNOSIS — I700 Atherosclerosis of aorta: Secondary | ICD-10-CM

## 2021-07-13 MED ORDER — ROSUVASTATIN CALCIUM 10 MG PO TABS: 10.0000 mg | ORAL_TABLET | Freq: Every day | ORAL | 2 refills | Status: DC

## 2021-07-15 NOTE — Progress Notes (Signed)
?Cardiology Office Note:   ?Date:  07/16/2021  ?NAME:  Gary Blair    ?MRN: [id number] ?DOB:  [birth year]  ? ?PCP:  Rita Ohara, MD  ?Cardiologist:  None  ?Electrophysiologist:  None  ? ?Referring MD: Rita Ohara, MD  ? ?Chief Complaint  ?Patient presents with  ? Chest Pain  ? ? ?History of Present Illness:   ?ENDRIT Gary Blair is a 71 y.o. male with a hx of HTN, HLD, CAD who is being seen today for the evaluation of chest pain at the request of Rita Ohara, MD. he reports for the last 4 months has had a dull achy pain in the center of his chest.  Also occurs in the left chest.  Mainly occurs with laying down.  Occurs 3-4 times per week.  Occurs at night.  No identifiable trigger.  No alleviating factor.  He reports this sensation can last minutes and go away.  He actually does quite a bit of exercise.  He does aerobic activity on the elliptical as well as treadmill.  No significant chest pressure or discomfort.  He reports no shortness of breath.  CVD risk factors include hypertension as well as former tobacco abuse.  Most recent LDL cholesterol 136.  Appropriately started on Crestor by his primary care physician.  BP 140/68.  He is on lisinopril.  His mother had a heart attack.  Again he is a former smoker.  He is a retired Administrator.  Denies any significant symptoms in office.  EKG 07/05/2020 demonstrated normal sinus rhythm with no acute ischemic changes or evidence of infarction.  Coronary calcium scoring was reviewed.  This demonstrates a calcium score of 1184.  This is 84th percentile. ? ?Problem List ?HTN ?HLD ?-T chol 221, HDL 76, LDL 136, TG 53 ?CAD ?-CAC 1184 (85th percentile) ? ?Past Medical History: ?Past Medical History:  ?Diagnosis Date  ? Arthritis   ? At risk for sleep apnea   ? STOP-BANG= 4        SENT TO PCP 10-25-2014  ? Borderline hyperlipidemia   ? Carpal tunnel syndrome on both sides   ? right > left  ? Diverticulosis of colon   ? moderate  ? History of adenomatous polyp of colon   ? 2006,  2010,   tubular adenoma 04-27-2014 due again 03/2017  ? History of chronic sinusitis   ? History of gastric ulcer   ? yrs ago  ? History of gastroesophageal reflux (GERD)   ? Hypertension   ? Prostate cancer [facility]) dx 06/2014  ? urologist-  Dr. Diona Fanti--  Stage T1c,  Gleason (3+3) 6,  vol. 25.51m  ? S/P radiation therapy 10/27/14  ? Prostate 14,500 cGy implanting 67  I-125 seeds utilizing 31 needles.  Individual seed activity 0.363 mCi per seed for a total implant activity of 24.32 mCi.  ? Wears glasses   ? ? ?Past Surgical History: ?Past Surgical History:  ?Procedure Laterality Date  ? CARPAL TUNNEL RELEASE Right 06/2016  ? Dr. WBurney Gauze ? COLONOSCOPY  04-27-2014, 06/2017  ? LUMBAR LAMINECTOMY  1984  ? thinks L5 (updated 05/2019; prev had documented at cervical laminectomy)  ? PROSTATE BIOPSY  07/20/14  ? RADIOACTIVE SEED IMPLANT N/A 10/27/2014  ? Procedure: RADIOACTIVE SEED IMPLANT/BRACHYTHERAPY IMPLANT;  Surgeon: SFranchot Gallo MD;  [facility]  Service: Urology;  Laterality: N/A;  67 seeds implanted ?no seeds found in bladder ?  ? TONSILLECTOMY  child  ? ? ?Current Medications: ?Current Meds  ?  Medication Sig  ? fluticasone (FLONASE) 50 MCG/ACT nasal spray Use 2 spray(s) in each nostril once daily  ? lisinopril (ZESTRIL) 5 MG tablet Take 1 tablet (5 mg total) by mouth daily.  ? metoprolol tartrate (LOPRESSOR) 100 MG tablet Take 1 tablet by mouth once for procedure.  ? Multiple Vitamins-Minerals (MULTIVITAMIN WITH MINERALS) tablet Take 1 tablet by mouth daily.  ? Omega-3 Fatty Acids (FISH OIL) 1200 MG CAPS Take 1 capsule by mouth daily.  ? rosuvastatin (CRESTOR) 10 MG tablet Take 1 tablet (10 mg total) by mouth daily.  ? vitamin C (ASCORBIC ACID) 500 MG tablet Take 500 mg by mouth daily.  ?  ? ?Allergies:    ?Augmentin [amoxicillin-pot clavulanate], Ciprofloxacin hcl, Ibuprofen, Penicillins, and Sulfa antibiotics  ? ?Social History: ?Social History  ? ?Socioeconomic History  ? Marital status:  Married  ?  Spouse name: Not on file  ? Number of children: 1  ? Years of education: Not on file  ? Highest education level: Not on file  ?Occupational History  ? Occupation: Truck Geophysicist/field seismologist  ?  Employer: [employer]  ?  Comment: Logistics  ?Tobacco Use  ? Smoking status: Former  ?  Packs/day: 0.50  ?  Years: 45.00  ?  Pack years: 22.50  ?  Types: Cigarettes  ?  Quit date: 03/29/2013  ?  Years since quitting: 8.3  ? Smokeless tobacco: Never  ?Vaping Use  ? Vaping Use: Never used  ?Substance and Sexual Activity  ? Alcohol use: Yes  ?  Alcohol/week: 10.0 - 12.0 standard drinks  ?  Types: 10 - 12 Cans of beer per week  ?  Comment: 1 drink daily, sometimes more with watching ballgames, yardwork  ? Drug use: No  ? Sexual activity: Not Currently  ?  Partners: Female  ?  Comment: ED (and wife's issues)  ?Other Topics Concern  ? Not on file  ?Social History Narrative  ? Married. No pets.  Daughter lives in [place]  ? ?Social Determinants of Health  ? ?Financial Resource Strain: Not on file  ?Food Insecurity: Not on file  ?Transportation Needs: Not on file  ?Physical Activity: Not on file  ?Stress: Not on file  ?Social Connections: Not on file  ?  ? ?Family History: ?The patient's family history includes Heart attack in his brother and mother; Hypertension in his father; Prostate cancer in his father; Stroke in his father. There is no history of Diabetes. ? ?ROS:   ?All other ROS reviewed and negative. Pertinent positives noted in the HPI.    ? ?EKGs/Labs/Other Studies Reviewed:   ?The following studies were personally reviewed by me today: ? ?EKG: EKG reviewed from primary care physician office on 07/05/2021.  This EKG demonstrates sinus rhythm with no acute ischemic changes or evidence of infarction. ? ?Recent Labs: ?07/04/2021: ALT 19; BUN 15; Creatinine, Ser 1.16; Hemoglobin 15.5; Platelets 198; Potassium 4.9; Sodium 143  ? ?Recent Lipid Panel ?   ?Component Value Date/Time  ? CHOL 221 (H) 07/04/2021 0836  ? TRIG 53  07/04/2021 0836  ? HDL 76 07/04/2021 0836  ? CHOLHDL 2.9 07/04/2021 0836  ? CHOLHDL 2.6 04/17/2016 0844  ? VLDL 12 04/17/2016 0844  ? [place] 136 (H) 07/04/2021 0836  ? ? ?Physical Exam:   ?VS:  BP 140/68   Pulse 71   Ht '5\' 3"'$  (1.6 m)   Wt 128 lb 3.2 oz (58.2 kg)   SpO2 92%   BMI 22.71 kg/m?    ?  Wt Readings from Last 3 Encounters:  ?07/16/21 128 lb 3.2 oz (58.2 kg)  ?07/05/21 128 lb (58.1 kg)  ?02/19/21 127 lb (57.6 kg)  ?  ?General: Well nourished, well developed, in no acute distress ?Head: Atraumatic, normal size  ?Eyes: PEERLA, EOMI  ?Neck: Supple, no JVD ?Endocrine: No thryomegaly ?Cardiac: Normal S1, S2; RRR; no murmurs, rubs, or gallops ?Lungs: Clear to auscultation bilaterally, no wheezing, rhonchi or rales  ?Abd: Soft, nontender, no hepatomegaly  ?Ext: No edema, pulses 2+ ?Musculoskeletal: No deformities, BUE and BLE strength normal and equal ?Skin: Warm and dry, no rashes   ?Neuro: Alert and oriented to person, place, time, and situation, CNII-XII grossly intact, no focal deficits  ?Psych: Normal mood and affect  ? ?ASSESSMENT:   ?SELAH ZELMAN is a 71 y.o. male who presents for the following: ?1. Precordial pain   ?2. Agatston coronary artery calcium score greater than 400   ?3. Mixed hyperlipidemia   ? ? ?PLAN:   ?1. Precordial pain ?2. Agatston coronary artery calcium score greater than 400 ?3. Mixed hyperlipidemia ?-He presents with dull achy pain that occurs at night.  Occurs when laying back.  Does not appear to occur with activity.  Coronary calcium score severely elevated at 1184.  He has extensive calcifications and three-vessel distribution.  Symptoms do not occur with activity.  EKG from PCP shows normal sinus rhythm with no acute ischemic changes or evidence of infarction.  To further restratify him I believe he needs a coronary CTA.  This will let us know if he has any obstructive CAD or dangerous blockages that merit revascularization.  His symptoms really are not that convincing of  angina.  He should be on an aspirin given a calcium score over 1000.  Started on Crestor 10 mg daily.  He will have this rechecked by his primary care physician.  Would shoot for a goal LDL less than 50.  CVD risk fact

## 2021-07-16 ENCOUNTER — Ambulatory Visit: Payer: Medicare HMO | Admitting: Cardiovascular Disease

## 2021-07-16 ENCOUNTER — Encounter: Payer: Self-pay | Admitting: Cardiovascular Disease

## 2021-07-16 ENCOUNTER — Other Ambulatory Visit: Payer: Self-pay

## 2021-07-16 VITALS — BP 140/68 | HR 71 | Ht 63.0 in | Wt 128.2 lb

## 2021-07-16 DIAGNOSIS — R072 Precordial pain: Secondary | ICD-10-CM | POA: Diagnosis not present

## 2021-07-16 DIAGNOSIS — E782 Mixed hyperlipidemia: Secondary | ICD-10-CM | POA: Diagnosis not present

## 2021-07-16 DIAGNOSIS — R931 Abnormal findings on diagnostic imaging of heart and coronary circulation: Secondary | ICD-10-CM | POA: Diagnosis not present

## 2021-07-16 MED ORDER — METOPROLOL TARTRATE 100 MG PO TABS: ORAL_TABLET | ORAL | 0 refills | Status: DC

## 2021-07-16 NOTE — Patient Instructions (Addendum)
Medication Instructions:  ?Take Metoprolol 100 mg two hours before the CT scan when scheduled.  ?Take baby aspirin 81 mg daily  ? ?*If you need a refill on your cardiac medications before your next appointment, please call your pharmacy* ? ? ?Testing/Procedures: ? ?Echocardiogram - Your physician has requested that you have an echocardiogram. Echocardiography is a painless test that uses sound waves to create images of your heart. It provides your doctor with information about the size and shape of your heart and how well your heart?s chambers and valves are working. This procedure takes approximately one hour. There are no restrictions for this procedure. This will be performed at either our [facility] location - [address], [place] location [employer] 2nd floor. ? ?Your physician has requested that you have cardiac CT. Cardiac computed tomography (CT) is a painless test that uses an x-ray machine to take clear, detailed pictures of your heart. For further information please visit [email or web address]. Please follow instruction sheet as given. ? ? ? ?Follow-Up: ?At [facility], you and your health needs are our priority.  As part of our continuing mission to provide you with exceptional heart care, we have created designated Provider Care Teams.  These Care Teams include your primary Cardiologist (physician) and Advanced Practice Providers (APPs -  Physician Assistants and Nurse Practitioners) who all work together to provide you with the care you need, when you need it. ? ?We recommend signing up for the patient portal called "MyChart".  Sign up information is provided on this After Visit Summary.  MyChart is used to connect with patients for Virtual Visits (Telemedicine).  Patients are able to view lab/test results, encounter notes, upcoming appointments, etc.  Non-urgent messages can be sent to your provider as well.   ?To learn more about what you can do with MyChart, go to  [email or web address].   ? ?Your next appointment:   ?6 month(s) ? ?The format for your next appointment:   ?In Person ? ?Provider:   ?Eleonore Chiquito, MD  ? ? ?Other Instructions ? ? ?Your cardiac CT will be scheduled at one of the below locations:  ? ?[facility] ?[address] ?[place], [place] [address] ?[phone] ? ?If scheduled at [facility], please arrive at the [facility] and Children's Entrance (Entrance C2) of [facility] 30 minutes prior to test start time. ?You can use the FREE valet parking offered at entrance C (encouraged to control the heart rate for the test)  ?Proceed to the [facility] Radiology Department (first floor) to check-in and test prep. ? ?All radiology patients and guests should use entrance C2 at [facility], accessed from [facility], even though the hospital's physical address listed is [address] ? ? ? ? ?Please follow these instructions carefully (unless otherwise directed): ? ?Hold all erectile dysfunction medications at least 3 days (72 hrs) prior to test. ? ?On the Night Before the Test: ?Be sure to Drink plenty of water. ?Do not consume any caffeinated/decaffeinated beverages or chocolate 12 hours prior to your test. ?Do not take any antihistamines 12 hours prior to your test. ? ?On the Day of the Test: ?Drink plenty of water until 1 hour prior to the test. ?Do not eat any food 4 hours prior to the test. ?You may take your regular medications prior to the test.  ?Take metoprolol (Lopressor) two hours prior to test. ?HOLD Furosemide/Hydrochlorothiazide morning of the test. ?FEMALES- please  wear underwire-free bra if available, avoid dresses & tight clothing ?   ?After the Test: ?Drink plenty of water. ?After receiving IV contrast, you may experience a mild flushed feeling. This is normal. ?On occasion, you may experience a mild rash up to 24 hours after the test. This is not dangerous. If this occurs, you can take  Benadryl 25 mg and increase your fluid intake. ?If you experience trouble breathing, this can be serious. If it is severe call 911 IMMEDIATELY. If it is mild, please call our office. ?If you take any of these medications: Glipizide/Metformin, Avandament, Glucavance, please do not take 48 hours after completing test unless otherwise instructed. ? ?We will call to schedule your test 2-4 weeks out understanding that some insurance companies will need an authorization prior to the service being performed.  ? ?For non-scheduling related questions, please contact the cardiac imaging nurse navigator should you have any questions/concerns: ?Marchia Bond, Cardiac Imaging Nurse Navigator ?Gordy Clement, Cardiac Imaging Nurse Navigator ?[facility] ?Direct Office Dial: [phone]  ? ?For scheduling needs, including cancellations and rescheduling, please call [place], [phone]. ? ? ?

## 2021-07-17 ENCOUNTER — Ambulatory Visit (INDEPENDENT_AMBULATORY_CARE_PROVIDER_SITE_OTHER): Payer: Medicare HMO

## 2021-07-17 DIAGNOSIS — R072 Precordial pain: Secondary | ICD-10-CM | POA: Diagnosis not present

## 2021-07-17 LAB — ECHOCARDIOGRAM COMPLETE
AR max vel: 4.11 cm2
AV Area VTI: 4.05 cm2
AV Area mean vel: 3.91 cm2
AV Mean grad: 5 mmHg
AV Peak grad: 8.9 mmHg
Ao pk vel: 1.49 m/s
Area-P 1/2: 2.68 cm2
Calc EF: 65.1 %
S' Lateral: 2.07 cm
Single Plane A2C EF: 66.8 %
Single Plane A4C EF: 67 %

## 2021-07-31 ENCOUNTER — Telehealth (HOSPITAL_COMMUNITY): Payer: Self-pay | Admitting: *Deleted

## 2021-07-31 NOTE — Telephone Encounter (Signed)
Reaching out to patient to offer assistance regarding upcoming cardiac imaging study; pt verbalizes understanding of appt date/time, parking situation and where to check in, pre-test NPO status and medications ordered, and verified current allergies; name and call back number provided for further questions should they arise ? ?Gordy Clement RN Navigator Cardiac Imaging ? [facility] ?[phone] office ?[phone] cell ? ?Patient to take '100mg'$  metoprolol tartrate two hours prior to his cardiac CT. He is aware to arrive at 8:30am. ?

## 2021-08-02 ENCOUNTER — Other Ambulatory Visit: Payer: Self-pay | Admitting: Cardiology

## 2021-08-02 ENCOUNTER — Ambulatory Visit (HOSPITAL_COMMUNITY)
Admission: RE | Admit: 2021-08-02 | Discharge: 2021-08-02 | Disposition: A | Discharge status: Home / Self Care | Payer: Medicare HMO | Source: Ambulatory Visit | Attending: Cardiology | Admitting: Cardiology

## 2021-08-02 ENCOUNTER — Ambulatory Visit (HOSPITAL_COMMUNITY)
Admission: RE | Admit: 2021-08-02 | Discharge: 2021-08-02 | Disposition: A | Discharge status: Home / Self Care | Payer: Medicare HMO | Source: Ambulatory Visit | Attending: Cardiovascular Disease | Admitting: Cardiovascular Disease

## 2021-08-02 DIAGNOSIS — R931 Abnormal findings on diagnostic imaging of heart and coronary circulation: Secondary | ICD-10-CM | POA: Insufficient documentation

## 2021-08-02 DIAGNOSIS — R072 Precordial pain: Secondary | ICD-10-CM | POA: Diagnosis present

## 2021-08-02 DIAGNOSIS — I2510 Atherosclerotic heart disease of native coronary artery without angina pectoris: Secondary | ICD-10-CM | POA: Diagnosis not present

## 2021-08-02 MED ORDER — NITROGLYCERIN 0.4 MG SL SUBL
SUBLINGUAL_TABLET | SUBLINGUAL | Status: AC
  Filled 2021-08-02: qty 2

## 2021-08-02 MED ORDER — IOHEXOL 350 MG/ML SOLN
95.0000 mL | Freq: Once | INTRAVENOUS | Status: AC | PRN
  Administered 2021-08-02: 95 mL via INTRAVENOUS

## 2021-08-02 MED ORDER — NITROGLYCERIN 0.4 MG SL SUBL
0.8000 mg | SUBLINGUAL_TABLET | Freq: Once | SUBLINGUAL | Status: AC
  Administered 2021-08-02: 0.8 mg via SUBLINGUAL

## 2021-08-10 ENCOUNTER — Telehealth: Payer: Self-pay | Admitting: Cardiovascular Disease

## 2021-08-10 NOTE — Telephone Encounter (Signed)
Patient is calling about his CT results.  ?

## 2021-08-10 NOTE — Telephone Encounter (Signed)
Called patient, advised of results.  Patient verbalized understanding. Thankful for call back.  

## 2021-08-15 NOTE — Progress Notes (Signed)
Chief Complaint  ?Patient presents with  ? Follow-up  ?  6 week follow up. Thinks he may have an ingrown toenail L great toenail. Also when he was at pharmacy he was told he was due for pneumonia vaccine. Patient brought in list of blood pressures today.   ? ?Patient presents for 6 week f/u on blood pressure, since lowering lisinopril to '5mg'$ . At his physical he noted BP's running 105-110/60's. He had reported some dizziness with position changes when at the gym. ?Since lowering the dose, BP's have been running 102-140/58-70, mostly 110-122/60-70 (average 119/66 per pt's calculation on his paper). ?Doesn't recall any dizziness at the gym since he lowered the dose.  ?Denies headaches, palpitations, edema, shortness of breath. ? ?BP Readings from Last 3 Encounters:  ?08/16/21 112/64  ?08/02/21 124/76  ?07/16/21 140/68  ? ? ?He had reported some atypical chest pain at his physical. That, along with his intermittently elevated cholesterol readings, was reason for doing Calcium score, which was very high.  He was referred to cardiology, underwent coronary CTA. This showed 50-69% mid RCA stenosis.  CT FFR was performed, suggesting nonobstructive CAD. ?He was started on '10mg'$  of rosuvastatin (at his last visit with me) and aspirin (by cardiologist). Denies any side effects.  Goal LDL <50. Scheduled for f/u labs next month ? ? ?Lab Results  ?Component Value Date  ? CHOL 221 (H) 07/04/2021  ? HDL 76 07/04/2021  ? LDLCALC 136 (H) 07/04/2021  ? TRIG 53 07/04/2021  ? CHOLHDL 2.9 07/04/2021  ? ?He states the pharmacist told him that he is due for a pneumonia shot. He is asking if this is true. ? ?Immunization History  ?Administered Date(s) Administered  ? Influenza Split 03/16/2005, 02/15/2006, 02/01/2007, 02/25/2008, 04/20/2009, 01/28/2012  ? Influenza Whole 02/09/2013  ? Influenza, High Dose Seasonal PF 01/23/2016, 01/27/2017, 01/04/2018, 12/20/2018, 12/29/2019  ? Influenza,trivalent, recombinat, inj, PF 01/08/2014  ?  Influenza-Unspecified 01/01/2015, 01/04/2018, 12/28/2020  ? PFIZER(Purple Top)SARS-COV-2 Vaccination 06/05/2019, 06/30/2019, 02/01/2020, 09/04/2020  ? Pension scheme manager 23yr & up 01/19/2021  ? Pneumococcal Conjugate-13 04/11/2014  ? Pneumococcal Polysaccharide-23 12/14/2004, 04/17/2015  ? Td 06/27/1989, 09/30/2001  ? Tdap 05/20/2015  ? Zoster Recombinat (Shingrix) 05/30/2020, 07/29/2020  ? Zoster, Live 04/26/2011  ? ? ?L great toenail has been bothering him for 1-2 weeks. ?Having pain when wearing work boots, mowing the yard. ?No pain in tennis shoes. ? ? ?PMH, PSH, SH reviewed ? ?Outpatient Encounter Medications as of 08/16/2021  ?Medication Sig  ? aspirin EC 81 MG tablet Take 81 mg by mouth daily. Swallow whole.  ? fluticasone (FLONASE) 50 MCG/ACT nasal spray Use 2 spray(s) in each nostril once daily  ? lisinopril (ZESTRIL) 5 MG tablet Take 1 tablet (5 mg total) by mouth daily.  ? Multiple Vitamins-Minerals (MULTIVITAMIN WITH MINERALS) tablet Take 1 tablet by mouth daily.  ? Omega-3 Fatty Acids (FISH OIL) 1200 MG CAPS Take 1 capsule by mouth daily.  ? rosuvastatin (CRESTOR) 10 MG tablet Take 1 tablet (10 mg total) by mouth daily.  ? vitamin C (ASCORBIC ACID) 500 MG tablet Take 500 mg by mouth daily.  ? [DISCONTINUED] metoprolol tartrate (LOPRESSOR) 100 MG tablet Take 1 tablet by mouth once for procedure.  ? ?No facility-administered encounter medications on file as of 08/16/2021.  ? ?Allergies  ?Allergen Reactions  ? Augmentin [Amoxicillin-Pot Clavulanate] Swelling  ? Ciprofloxacin Hcl Other (See Comments)  ?  Stated reddened skin  ? Ibuprofen Itching  ? Penicillins   ?  Pruritis,  joint pains,throat swelling when he took Augmentin  ? Sulfa Antibiotics Rash  ? ? ?ROS:  no fever, chills, URI symptoms.  Rare, atypical CP. No headache, dizziness, shortness of breath, palpitations.  No abdominal pain, myalgias, bleeding.  Some bruising after recent yardwork noted. ?No myalgias. No n/v/d, rashes,  edema.  L great toe pain per HPI. ?Not aware of any weight change (reports wearing lighter clothing today (warm out) ?See HPI ? ? ? ?PHYSICAL EXAM: ? ?BP 112/64   Pulse 68   Ht '5\' 3"'$  (1.6 m)   Wt 123 lb 12.8 oz (56.2 kg)   BMI 21.93 kg/m?  ? ?Wt Readings from Last 3 Encounters:  ?08/16/21 123 lb 12.8 oz (56.2 kg)  ?07/16/21 128 lb 3.2 oz (58.2 kg)  ?07/05/21 128 lb (58.1 kg)  ? ?Pleasant, well-appearing male in NAD ?HEENT: conjunctiva and sclera are clear, EOMI ?Heart: regular rate and rhythm, no murmur ?Lungs: clear bilaterally ?Abdomen: soft, nontender ?Skin: Purpura on dorsum of hands/wrists and distal forearms ?Scrapes at medial/dorsal R wrist. Nontender ganglion cyst present in similar location. ? ?L great toenail--medially has slight erythema along the cuticle edge.  ?No ingrowing of the nail.  Only slightly pink, no STS. Nontender ?2+ pulses ? ?Psych: normal mood, affect, hygiene and grooming ?Neuro: alert and oriented, normal strength, gait. Cranial nerves grossly intact ? ? ? ?ASSESSMENT/PLAN ? ?Essential hypertension, benign - well controlled; continue '5mg'$  dose. Cont monitor BP and contact us if SBP <105 or any dizziness ? ?Coronary artery disease involving native coronary artery of native heart without angina pectoris - cont ASA, statin ? ?Aortic atherosclerosis (HCC) - cont statin ? ?Vaccine counseling - reviewed guidelines for pneumonia vaccines. Had Prevnar-13 (and 2 doses of pneumovax); not a candidate for prevnar 20 (unless guidelines change) ? ?Great toe pain, left - no e/o ingrowing nail or paronychia. May be from the boots, too tight? Soak prn.  s/sx for which he should return reviewed ? ?Senile purpura (HCC) - on hands, forearms.  Due to age and now ASA ? ? ?Return for lipids/LFT's as planned for next month ?Will forward results to cardiologist, and adjust statin dose as needed. ? ?I spent 33 minutes dedicated to the care of this patient, including pre-visit review of records, face to face  time, post-visit ordering of testing and documentation. ? ?Send lipid results to cardio next month. ?Adjust dose as needed to get to goal. ?

## 2021-08-16 ENCOUNTER — Encounter: Payer: Self-pay | Admitting: Family Medicine

## 2021-08-16 ENCOUNTER — Ambulatory Visit (INDEPENDENT_AMBULATORY_CARE_PROVIDER_SITE_OTHER): Payer: Medicare HMO | Admitting: Family Medicine

## 2021-08-16 VITALS — BP 112/64 | HR 68 | Ht 63.0 in | Wt 123.8 lb

## 2021-08-16 DIAGNOSIS — M79675 Pain in left toe(s): Secondary | ICD-10-CM

## 2021-08-16 DIAGNOSIS — Z7185 Encounter for immunization safety counseling: Secondary | ICD-10-CM

## 2021-08-16 DIAGNOSIS — I700 Atherosclerosis of aorta: Secondary | ICD-10-CM

## 2021-08-16 DIAGNOSIS — I2510 Atherosclerotic heart disease of native coronary artery without angina pectoris: Secondary | ICD-10-CM

## 2021-08-16 DIAGNOSIS — I10 Essential (primary) hypertension: Secondary | ICD-10-CM

## 2021-08-16 DIAGNOSIS — D692 Other nonthrombocytopenic purpura: Secondary | ICD-10-CM

## 2021-08-16 NOTE — Patient Instructions (Addendum)
Continue the aspirin.  Let us know if you develop any stomach (upper abdominal) pain.  You may notice more bruising, and this is normal. ? ?Return as scheduled for fasting lab visit next month.  We will adjust the cholesterol medication dose at that time, if needed. Continue to try and follow a low cholesterol diet. ? ?Continue '5mg'$  of lisinopril. ?If you notice recurrent dizziness, or blood pressures consistently <161 systolic, let us know. ?Continue low sodium diet, regular exercise, and be sure to stay well hydrated at the gym (and when doing yardwork in the heat). ? ?You have had a prevnar-15 in the past, so technically are not a candidate to get the new Prevnar-20.  Currently guidelines state to get the Prevnar-20 only if you've never had any other prevnar vaccine. ?You are up to date on the pneumovax (last in 2016; this is a different type of pneumonia vaccine). ? ?No evidence of ingrown toenail or paronychia (infected skin/cuticle). ?Try and avoid the shoes/boots that cause discomfort. ?Soak the toe when it does hurt. ?If it has persistent redness, swelling, warmth, or any drainage or fever develops, return for re-evaluation. ? ? ? ?

## 2021-09-12 ENCOUNTER — Telehealth: Payer: Self-pay | Admitting: *Deleted

## 2021-09-12 ENCOUNTER — Other Ambulatory Visit: Payer: Medicare HMO

## 2021-09-12 DIAGNOSIS — E7800 Pure hypercholesterolemia, unspecified: Secondary | ICD-10-CM | POA: Diagnosis not present

## 2021-09-12 DIAGNOSIS — Z5181 Encounter for therapeutic drug level monitoring: Secondary | ICD-10-CM

## 2021-09-12 NOTE — Telephone Encounter (Signed)
Patient advised.

## 2021-09-12 NOTE — Telephone Encounter (Signed)
Ok to miss today's dose ?

## 2021-09-12 NOTE — Telephone Encounter (Signed)
Patient was in this am for lipid/liver and took his last rosuvastatin yesterday. He wants to know if he should fill his last refill on the '10mg'$  or wait and skip one dose today and wait until lab results are in tomorrow in case of change. ?

## 2021-09-13 LAB — LIPID PANEL
Chol/HDL Ratio: 1.8 ratio (ref 0.0–5.0)
Cholesterol, Total: 144 mg/dL (ref 100–199)
HDL: 80 mg/dL (ref >39)
LDL Chol Calc (NIH): 54 mg/dL (ref 0–99)
Triglycerides: 43 mg/dL (ref 0–149)
VLDL Cholesterol Cal: 10 mg/dL (ref 5–40)

## 2021-09-13 LAB — HEPATIC FUNCTION PANEL
ALT: 22 IU/L (ref 0–44)
AST: 23 IU/L (ref 0–40)
Albumin: 4.4 g/dL (ref 3.7–4.7)
Alkaline Phosphatase: 68 IU/L (ref 44–121)
Bilirubin Total: 0.8 mg/dL (ref 0.0–1.2)
Bilirubin, Direct: 0.27 mg/dL (ref 0.00–0.40)
Total Protein: 6.5 g/dL (ref 6.0–8.5)

## 2021-10-04 ENCOUNTER — Other Ambulatory Visit: Payer: Self-pay | Admitting: Family Medicine

## 2021-10-04 DIAGNOSIS — I10 Essential (primary) hypertension: Secondary | ICD-10-CM

## 2021-10-09 ENCOUNTER — Other Ambulatory Visit: Payer: Self-pay | Admitting: Family Medicine

## 2021-10-09 DIAGNOSIS — E7800 Pure hypercholesterolemia, unspecified: Secondary | ICD-10-CM

## 2021-10-09 DIAGNOSIS — I700 Atherosclerosis of aorta: Secondary | ICD-10-CM

## 2021-10-09 NOTE — Telephone Encounter (Signed)
Gary Blair called in to ask if he should still be taking his Crestor medication. He states if he does, he needs a refill.

## 2021-12-31 ENCOUNTER — Other Ambulatory Visit: Payer: Self-pay | Admitting: Medical

## 2021-12-31 DIAGNOSIS — I10 Essential (primary) hypertension: Secondary | ICD-10-CM

## 2022-01-02 ENCOUNTER — Encounter: Payer: Self-pay | Admitting: Internal Medicine

## 2022-01-03 ENCOUNTER — Encounter: Payer: Self-pay | Admitting: *Deleted

## 2022-01-08 DIAGNOSIS — D225 Melanocytic nevi of trunk: Secondary | ICD-10-CM | POA: Diagnosis not present

## 2022-01-08 DIAGNOSIS — L570 Actinic keratosis: Secondary | ICD-10-CM | POA: Diagnosis not present

## 2022-01-08 DIAGNOSIS — D692 Other nonthrombocytopenic purpura: Secondary | ICD-10-CM | POA: Diagnosis not present

## 2022-01-08 DIAGNOSIS — L821 Other seborrheic keratosis: Secondary | ICD-10-CM | POA: Diagnosis not present

## 2022-01-08 DIAGNOSIS — L720 Epidermal cyst: Secondary | ICD-10-CM | POA: Diagnosis not present

## 2022-01-08 DIAGNOSIS — Z85828 Personal history of other malignant neoplasm of skin: Secondary | ICD-10-CM | POA: Diagnosis not present

## 2022-01-08 DIAGNOSIS — D1801 Hemangioma of skin and subcutaneous tissue: Secondary | ICD-10-CM | POA: Diagnosis not present

## 2022-01-13 NOTE — Progress Notes (Unsigned)
Cardiology Office Note:   Date:  01/16/2022  NAME:  Gary Blair    MRN: [id number] DOB:  06-Jul-1950   PCP:  Rita Ohara, MD  Cardiologist:  None  Electrophysiologist:  None   Referring MD: Rita Ohara, MD   Chief Complaint  Patient presents with   Follow-up   History of Present Illness:   Gary Blair is a 71 y.o. male with a hx of CAD, HTN, HLD who presents for follow-up.   She reports she is doing well.  Still having a discomfort in his chest that occurs at night.  Can last seconds to minutes.  Occurs at night.  He is still maintaining a high level of activity.  He is doing 30 to 45 minutes at the gym without chest pains or trouble breathing.  His most recent lipids are well controlled.  Blood pressure is well controlled he remains on aspirin.  No symptoms of angina.  Problem List CAD -CAC score 1343 (87th percentile) -mid RCA 50-69% (CT FFR 0.80) 2. HLD -T chol 144, HDL 80, LDL 54, triglycerides 43 3. HTN  Past Medical History: Past Medical History:  Diagnosis Date   Arthritis    At risk for sleep apnea    STOP-BANG= 4        SENT TO PCP 10-25-2014   Borderline hyperlipidemia    Carpal tunnel syndrome on both sides    right > left   Coronary artery disease    Diverticulosis of colon    moderate   History of adenomatous polyp of colon    2006,  2010,  tubular adenoma 04-27-2014 due again 03/2017   History of chronic sinusitis    History of gastric ulcer    yrs ago   History of gastroesophageal reflux (GERD)    Hypertension    Prostate cancer ([place]) dx 06/2014   urologist-  Dr. Diona Fanti--  Stage T1c,  Gleason (3+3) 6,  vol. 25.62m   S/P radiation therapy 10/27/2014   Prostate 14,500 cGy implanting 67  I-125 seeds utilizing 31 needles.  Individual seed activity 0.363 mCi per seed for a total implant activity of 24.32 mCi.   Wears glasses     Past Surgical History: Past Surgical History:  Procedure Laterality Date   CARPAL TUNNEL RELEASE Right 06/2016   Dr.  WBurney Gauze  COLONOSCOPY  04-27-2014, 06/2017   LUMBAR LAMINECTOMY  1984   thinks L5 (updated 05/2019; prev had documented at cervical laminectomy)   PROSTATE BIOPSY  07/20/14   RADIOACTIVE SEED IMPLANT N/A 10/27/2014   Procedure: RADIOACTIVE SEED IMPLANT/BRACHYTHERAPY IMPLANT;  Surgeon: SFranchot Gallo MD;  [facility]  Service: Urology;  Laterality: N/A;  631seeds implanted no seeds found in bladder    TONSILLECTOMY  child    Current Medications: Current Meds  Medication Sig   aspirin EC 81 MG tablet Take 81 mg by mouth daily. Swallow whole.   fluticasone (FLONASE) 50 MCG/ACT nasal spray Use 2 spray(s) in each nostril once daily   lisinopril (ZESTRIL) 5 MG tablet Take 1 tablet by mouth once daily   Multiple Vitamins-Minerals (MULTIVITAMIN WITH MINERALS) tablet Take 1 tablet by mouth daily.   Omega-3 Fatty Acids (FISH OIL) 1200 MG CAPS Take 1 capsule by mouth daily.   rosuvastatin (CRESTOR) 10 MG tablet Take 1 tablet by mouth once daily   vitamin C (ASCORBIC ACID) 500 MG tablet Take 500 mg by mouth daily.     Allergies:    Augmentin [amoxicillin-pot  clavulanate], Ciprofloxacin hcl, Ibuprofen, Penicillins, and Sulfa antibiotics   Social History: Social History   Socioeconomic History   Marital status: Married    Spouse name: Not on file   Number of children: 1   Years of education: Not on file   Highest education level: Not on file  Occupational History   Occupation: Truck Education administrator: [employer]    Comment: Logistics  Tobacco Use   Smoking status: Former    Packs/day: 0.50    Years: 45.00    Total pack years: 22.50    Types: Cigarettes    Quit date: 03/29/2013    Years since quitting: 8.8   Smokeless tobacco: Never  Vaping Use   Vaping Use: Never used  Substance and Sexual Activity   Alcohol use: Yes    Alcohol/week: 10.0 - 12.0 standard drinks of alcohol    Types: 10 - 12 Cans of beer per week    Comment: 1 drink daily,  sometimes more with watching ballgames, yardwork   Drug use: No   Sexual activity: Not Currently    Partners: Female    Comment: ED (and wife's issues)  Other Topics Concern   Not on file  Social History Narrative   Married. No pets.  Daughter lives in [place]   Social Determinants of Health   Financial Resource Strain: Not on file  Food Insecurity: Not on file  Transportation Needs: Not on file  Physical Activity: Not on file  Stress: Not on file  Social Connections: Not on file     Family History: The patient's family history includes Heart attack in his brother and mother; Hypertension in his father; Prostate cancer in his father; Stroke in his father. There is no history of Diabetes.  ROS:   All other ROS reviewed and negative. Pertinent positives noted in the HPI.     EKGs/Labs/Other Studies Reviewed:   The following studies were personally reviewed by me today:  Recent Labs: 07/04/2021: BUN 15; Creatinine, Ser 1.16; Hemoglobin 15.5; Platelets 198; Potassium 4.9; Sodium 143 09/12/2021: ALT 22   Recent Lipid Panel    Component Value Date/Time   CHOL 144 09/12/2021 0840   TRIG 43 09/12/2021 0840   HDL 80 09/12/2021 0840   CHOLHDL 1.8 09/12/2021 0840   CHOLHDL 2.6 04/17/2016 0844   VLDL 12 04/17/2016 0844   LDLCALC 54 09/12/2021 0840    Physical Exam:   VS:  BP 139/83   Pulse 78   Ht '5\' 4"'$  (1.626 m)   Wt 127 lb 9.6 oz (57.9 kg)   SpO2 94%   BMI 21.90 kg/m    Wt Readings from Last 3 Encounters:  01/16/22 127 lb 9.6 oz (57.9 kg)  08/16/21 123 lb 12.8 oz (56.2 kg)  07/16/21 128 lb 3.2 oz (58.2 kg)    General: Well nourished, well developed, in no acute distress Head: Atraumatic, normal size  Eyes: PEERLA, EOMI  Neck: Supple, no JVD Endocrine: No thryomegaly Cardiac: Normal S1, S2; RRR; no murmurs, rubs, or gallops Lungs: Clear to auscultation bilaterally, no wheezing, rhonchi or rales  Abd: Soft, nontender, no hepatomegaly  Ext: No edema, pulses  2+ Musculoskeletal: No deformities, BUE and BLE strength normal and equal Skin: Warm and dry, no rashes   Neuro: Alert and oriented to person, place, time, and situation, CNII-XII grossly intact, no focal deficits  Psych: Normal mood and affect   ASSESSMENT:   Gary Blair is a 71 y.o. male who presents  for the following: 1. Coronary artery disease involving native coronary artery of native heart without angina pectoris   2. Agatston coronary artery calcium score greater than 400   3. Mixed hyperlipidemia     PLAN:   1. Coronary artery disease involving native coronary artery of native heart without angina pectoris 2. Agatston coronary artery calcium score greater than 400 3. Mixed hyperlipidemia -Elevated coronary calcium score.  Moderate RCA disease negative by CT FFR.  No symptoms concerning for angina.  Exercising enough.  Dieting well.  LDL cholesterol is at goal.  We will continue aspirin 81 mg daily as well as Crestor 10 mg daily.  All of his levels are at goal.  He will see me back in 2 years.      Disposition: Return in about 2 years (around 01/17/2024).  Medication Adjustments/Labs and Tests Ordered: Current medicines are reviewed at length with the patient today.  Concerns regarding medicines are outlined above.  No orders of the defined types were placed in this encounter.  No orders of the defined types were placed in this encounter.   Patient Instructions  Medication Instructions:  The current medical regimen is effective;  continue present plan and medications.  *If you need a refill on your cardiac medications before your next appointment, please call your pharmacy*   Follow-Up: At [facility], you and your health needs are our priority.  As part of our continuing mission to provide you with exceptional heart care, we have created designated Provider Care Teams.  These Care Teams include your primary Cardiologist (physician) and Advanced Practice  Providers (APPs -  Physician Assistants and Nurse Practitioners) who all work together to provide you with the care you need, when you need it.  We recommend signing up for the patient portal called "MyChart".  Sign up information is provided on this After Visit Summary.  MyChart is used to connect with patients for Virtual Visits (Telemedicine).  Patients are able to view lab/test results, encounter notes, upcoming appointments, etc.  Non-urgent messages can be sent to your provider as well.   To learn more about what you can do with MyChart, go to [email or web address].    Your next appointment:   2 year(s)  The format for your next appointment:   In Person  Provider:   Eleonore Chiquito, MD         Time Spent with Patient: I have spent a total of 25 minutes with patient reviewing hospital notes, telemetry, EKGs, labs and examining the patient as well as establishing an assessment and plan that was discussed with the patient.  > 50% of time was spent in direct patient care.  Signed, Addison Naegeli. Audie Box, MD, [place]  [address] [place], [place] [address] [phone]  01/16/2022 9:20 AM

## 2022-01-16 ENCOUNTER — Encounter: Payer: Self-pay | Admitting: Cardiovascular Disease

## 2022-01-16 ENCOUNTER — Ambulatory Visit: Payer: Medicare HMO | Attending: Cardiovascular Disease | Admitting: Cardiovascular Disease

## 2022-01-16 VITALS — BP 139/83 | HR 78 | Ht 64.0 in | Wt 127.6 lb

## 2022-01-16 DIAGNOSIS — E782 Mixed hyperlipidemia: Secondary | ICD-10-CM

## 2022-01-16 DIAGNOSIS — R931 Abnormal findings on diagnostic imaging of heart and coronary circulation: Secondary | ICD-10-CM | POA: Diagnosis not present

## 2022-01-16 DIAGNOSIS — I2510 Atherosclerotic heart disease of native coronary artery without angina pectoris: Secondary | ICD-10-CM

## 2022-01-16 NOTE — Patient Instructions (Signed)
Medication Instructions:  The current medical regimen is effective;  continue present plan and medications.  *If you need a refill on your cardiac medications before your next appointment, please call your pharmacy*   Follow-Up: At [facility], you and your health needs are our priority.  As part of our continuing mission to provide you with exceptional heart care, we have created designated Provider Care Teams.  These Care Teams include your primary Cardiologist (physician) and Advanced Practice Providers (APPs -  Physician Assistants and Nurse Practitioners) who all work together to provide you with the care you need, when you need it.  We recommend signing up for the patient portal called "MyChart".  Sign up information is provided on this After Visit Summary.  MyChart is used to connect with patients for Virtual Visits (Telemedicine).  Patients are able to view lab/test results, encounter notes, upcoming appointments, etc.  Non-urgent messages can be sent to your provider as well.   To learn more about what you can do with MyChart, go to [email or web address].    Your next appointment:   2 year(s)  The format for your next appointment:   In Person  Provider:   Eleonore Chiquito, MD

## 2022-01-18 DIAGNOSIS — N5201 Erectile dysfunction due to arterial insufficiency: Secondary | ICD-10-CM | POA: Diagnosis not present

## 2022-01-18 DIAGNOSIS — Z8546 Personal history of malignant neoplasm of prostate: Secondary | ICD-10-CM | POA: Diagnosis not present

## 2022-02-05 ENCOUNTER — Encounter: Payer: Self-pay | Admitting: Internal Medicine

## 2022-03-20 ENCOUNTER — Telehealth: Payer: Self-pay | Admitting: Gastroenterology

## 2022-03-20 NOTE — Telephone Encounter (Signed)
Good Afternoon Dr. Tarri Glenn,  Patient called stating that in March he will need to have a repeat colonoscopy. Patient stated that his last colonoscopy was in March of 2018 with Dr. Erlene Quan at [facility] and is requesting to transfer his care to you because he has heard great things about you and because Dr. Erlene Quan has retired. Patients records from procedure are in epic, will you please review and advise on scheduling patient?  Thank you.

## 2022-03-28 ENCOUNTER — Other Ambulatory Visit: Payer: Self-pay | Admitting: Family Medicine

## 2022-03-28 DIAGNOSIS — I10 Essential (primary) hypertension: Secondary | ICD-10-CM

## 2022-04-05 NOTE — Telephone Encounter (Signed)
Called Dr. Erlene Quan office requested results from procedures and pathology reports. Sent over fax cover sheet with needed information.

## 2022-04-08 NOTE — Telephone Encounter (Signed)
Dr. Tarri Glenn,   I have received records that were requested from Dr. Erlene Quan office. I will be sending them up for you to review.    Will you please review and advise on scheduling?   Thank you.

## 2022-04-08 NOTE — Telephone Encounter (Signed)
Awaiting those records. Thanks.

## 2022-04-12 NOTE — Telephone Encounter (Signed)
Patient is calling to check status of his transfer of care. Please advise.

## 2022-04-17 DIAGNOSIS — H52203 Unspecified astigmatism, bilateral: Secondary | ICD-10-CM | POA: Diagnosis not present

## 2022-04-17 DIAGNOSIS — H2513 Age-related nuclear cataract, bilateral: Secondary | ICD-10-CM | POA: Diagnosis not present

## 2022-04-17 DIAGNOSIS — H524 Presbyopia: Secondary | ICD-10-CM | POA: Diagnosis not present

## 2022-04-17 DIAGNOSIS — H35371 Puckering of macula, right eye: Secondary | ICD-10-CM | POA: Diagnosis not present

## 2022-04-17 DIAGNOSIS — H43813 Vitreous degeneration, bilateral: Secondary | ICD-10-CM | POA: Diagnosis not present

## 2022-05-09 NOTE — Telephone Encounter (Signed)
Colonoscopy with Dr. Erlene Quan for a personal history of adenomatous polyps 07/15/2017 revealed a 5 mm ascending colon polyp, a 6 mm transverse colon polyp, and moderate left-sided diverticulosis.  Grade 2 internal hemorrhoids were seen.  Pathology results not included.  Please obtain pathology results from this procedure.

## 2022-05-17 NOTE — Telephone Encounter (Signed)
After multiple attempts of trying to get path from procedure I was not able to get record for patient. Patient called to follow up and I advised him he would need an appointment before a procedure can be scheduled. Patient was scheduled for 2/14 at 9:50

## 2022-05-27 ENCOUNTER — Encounter: Payer: Self-pay | Admitting: Gastroenterology

## 2022-06-12 ENCOUNTER — Ambulatory Visit (INDEPENDENT_AMBULATORY_CARE_PROVIDER_SITE_OTHER): Payer: Medicare HMO | Admitting: Gastroenterology

## 2022-06-12 ENCOUNTER — Encounter: Payer: Self-pay | Admitting: Gastroenterology

## 2022-06-12 VITALS — BP 148/78 | HR 92 | Ht 64.0 in | Wt 129.0 lb

## 2022-06-12 DIAGNOSIS — Z86010 Personal history of colonic polyps: Secondary | ICD-10-CM

## 2022-06-12 MED ORDER — NA SULFATE-K SULFATE-MG SULF 17.5-3.13-1.6 GM/177ML PO SOLN: 1.0000 | Freq: Once | ORAL | 0 refills | Status: AC

## 2022-06-12 NOTE — Progress Notes (Signed)
Referring Provider: Rita Ohara, MD Primary Care Physician:  Rita Ohara, MD  Reason for Consultation:  Surveillance colonoscopy   IMPRESSION:  History of colon polyps Family history of colon polyps (father)    PLAN: Colonoscopy   HPI: Gary Blair is a 72 y.o. male who presents for surveillance colonoscopy.  He understood that he was due surveillance this year.  Prior colonoscopies with Dr. Erlene Quan started at age 1: - Colonoscopy 13 years ago with Dr. Erlene Quan showed internal hemorrhoids, diverticulosis, and no polyps. Given a history of polyps, repeat colonoscopy recommended in 5 years.  - Colonoscopy 8 years ago showed adenomatous polyps - Colonoscopy with Dr. Erlene Quan for a personal history of adenomatous polyps 07/15/2017 revealed a 5 mm ascending colon polyp, a 6 mm transverse colon polyp, and moderate left-sided diverticulosis. Grade 2 internal hemorrhoids were seen. Pathology results not included.   He tolerated the prior procedures well - including prep, procedure, and recovery.   No baseline GI symptoms except for a distant history of hemorrhoids.  Father with colon polyps but he lived to 73. There is no other known family history of colon cancer or polyps. No family history of stomach cancer or other GI malignancy. No family history of inflammatory bowel disease or celiac.    Labs 07/04/21 included a normal CBC Labs 09/12/21 included normal liver enzymes   Past Medical History:  Diagnosis Date   Anal fissure    Arthritis    At risk for sleep apnea    STOP-BANG= 4        SENT TO PCP 10-25-2014   Borderline hyperlipidemia    Carpal tunnel syndrome on both sides    right > left   Coronary artery disease    Diverticulosis of colon    moderate   History of adenomatous polyp of colon    2006,  2010,  tubular adenoma 04-27-2014 due again 03/2017   History of chronic sinusitis    History of colon polyps    History of gastric ulcer    yrs ago   History of  gastroesophageal reflux (GERD)    Hypertension    Prostate cancer ([place]) dx 06/2014   urologist-  Dr. Diona Fanti--  Stage T1c,  Gleason (3+3) 6,  vol. 25.56m   S/P radiation therapy 10/27/2014   Prostate 14,500 cGy implanting 67  I-125 seeds utilizing 31 needles.  Individual seed activity 0.363 mCi per seed for a total implant activity of 24.32 mCi.   Wears glasses     Past Surgical History:  Procedure Laterality Date   CARPAL TUNNEL RELEASE Right 06/2016   Dr. WBurney Gauze  COLONOSCOPY  04-27-2014, 06/2017   LUMBAR LAMINECTOMY  1984   thinks L5 (updated 05/2019; prev had documented at cervical laminectomy)   PROSTATE BIOPSY  07/20/14   RADIOACTIVE SEED IMPLANT N/A 10/27/2014   Procedure: RADIOACTIVE SEED IMPLANT/BRACHYTHERAPY IMPLANT;  Surgeon: SFranchot Gallo MD;  [facility]  Service: Urology;  Laterality: N/A;  67 seeds implanted no seeds found in bladder    TONSILLECTOMY  child   Current Outpatient Medications  Medication Sig Dispense Refill   aspirin EC 81 MG tablet Take 81 mg by mouth daily. Swallow whole.     fluticasone (FLONASE) 50 MCG/ACT nasal spray Use 2 spray(s) in each nostril once daily 48 g 3   lisinopril (ZESTRIL) 5 MG tablet Take 1 tablet by mouth once daily 90 tablet 0   Multiple Vitamins-Minerals (MULTIVITAMIN WITH MINERALS) tablet Take 1 tablet  by mouth daily.     Omega-3 Fatty Acids (FISH OIL) 1200 MG CAPS Take 1 capsule by mouth daily.     rosuvastatin (CRESTOR) 10 MG tablet Take 1 tablet by mouth once daily 90 tablet 2   vitamin C (ASCORBIC ACID) 500 MG tablet Take 500 mg by mouth daily.     No current facility-administered medications for this visit.    Allergies as of 06/12/2022 - Review Complete 06/12/2022  Allergen Reaction Noted   Augmentin [amoxicillin-pot clavulanate] Swelling 10/21/2014   Ciprofloxacin hcl Other (See Comments) 11/22/2014   Ibuprofen Itching 05/24/2011   Penicillins  05/24/2011   Sulfa antibiotics Rash  05/24/2011    Family History  Problem Relation Age of Onset   Heart attack Mother        deceased age 22   Stroke Father        deceased age 52   Hypertension Father    Prostate cancer Father        prostate in his late 38's   Colon polyps Father    Heart attack Brother    Diabetes Neg Hx     Social History   Socioeconomic History   Marital status: Married    Spouse name: Not on file   Number of children: 1   Years of education: Not on file   Highest education level: Not on file  Occupational History   Occupation: Truck Education administrator: [employer]    Comment: Logistics  Tobacco Use   Smoking status: Former    Packs/day: 0.50    Years: 45.00    Total pack years: 22.50    Types: Cigarettes    Quit date: 03/29/2013    Years since quitting: 9.2   Smokeless tobacco: Never  Vaping Use   Vaping Use: Never used  Substance and Sexual Activity   Alcohol use: Yes    Alcohol/week: 10.0 - 12.0 standard drinks of alcohol    Types: 10 - 12 Cans of beer per week    Comment: 1 drink daily, sometimes more with watching ballgames, yardwork   Drug use: No   Sexual activity: Not Currently    Partners: Female    Comment: ED (and wife's issues)  Other Topics Concern   Not on file  Social History Narrative   Married. No pets.  Daughter lives in [place]   Social Determinants of Health   Financial Resource Strain: Not on file  Food Insecurity: Not on file  Transportation Needs: Not on file  Physical Activity: Not on file  Stress: Not on file  Social Connections: Not on file  Intimate Partner Violence: Not on file    Review of Systems: 12 system ROS is negative except as noted above.   Physical Exam: General:   Alert,  well-nourished, pleasant and cooperative in NAD Head:  Normocephalic and atraumatic. Eyes:  Sclera clear, no icterus.   Conjunctiva pink. Ears:  Normal auditory acuity. Nose:  No deformity, discharge,  or lesions. Mouth:  No deformity or  lesions.   Neck:  Supple; no masses or thyromegaly. Lungs:  Clear throughout to auscultation.   No wheezes. Heart:  Regular rate and rhythm; no murmurs. Abdomen:  Soft, nontender, nondistended, normal bowel sounds, no rebound or guarding. No hepatosplenomegaly.   Rectal:  Deferred  Msk:  Symmetrical. No boney deformities LAD: No inguinal or umbilical LAD Extremities:  No clubbing or edema. Neurologic:  Alert and  oriented x4;  grossly nonfocal Skin:  Intact  without significant lesions or rashes. Psych:  Alert and cooperative. Normal mood and affect.    Axiel Fjeld L. Tarri Glenn, MD, MPH 06/12/2022, 9:51 AM

## 2022-06-12 NOTE — Patient Instructions (Addendum)
It was my pleasure to provide care to you today. Based on our discussion, I am providing you with my recommendations below:  RECOMMENDATION(S):   You have been scheduled for a colonoscopy. Please follow written instructions given to you at your visit today.  Please pick up your prep supplies at the pharmacy within the next 1-3 days. If you use inhalers (even only as needed), please bring them with you on the day of your procedure.  FOLLOW UP:  After your procedure, you will receive a call from my office staff regarding my recommendation for follow up.  BMI:  If you are age 97 or older, your body mass index should be between 23-30. Your Body mass index is 22.14 kg/m. If this is out of the aforementioned range listed, please consider follow up with your Primary Care Provider.  If you are age 21 or younger, your body mass index should be between 19-25. Your Body mass index is 22.14 kg/m. If this is out of the aformentioned range listed, please consider follow up with your Primary Care Provider.   MY CHART:  The [facility] providers would like to encourage you to use [facility] to communicate with providers for non-urgent requests or questions.  Due to long hold times on the telephone, sending your provider a message by [facility] may be a faster and more efficient way to get a response.  Please allow 48 business hours for a response.  Please remember that this is for non-urgent requests.   Thank you for trusting me with your gastrointestinal care!    Thornton Park, MD

## 2022-07-02 ENCOUNTER — Encounter: Payer: Self-pay | Admitting: Gastroenterology

## 2022-07-03 ENCOUNTER — Telehealth: Payer: Self-pay | Admitting: Family Medicine

## 2022-07-03 DIAGNOSIS — I10 Essential (primary) hypertension: Secondary | ICD-10-CM

## 2022-07-03 MED ORDER — LISINOPRIL 5 MG PO TABS: 5.0000 mg | ORAL_TABLET | Freq: Every day | ORAL | 0 refills | Status: DC

## 2022-07-03 NOTE — Telephone Encounter (Signed)
Done

## 2022-07-03 NOTE — Telephone Encounter (Signed)
Pt called and is requesting a refill on his lisinopril Pt has a cpe on 07/17/2022 Please send to the [place], [place] - [address]

## 2022-07-04 ENCOUNTER — Telehealth: Payer: Self-pay | Admitting: Family Medicine

## 2022-07-04 DIAGNOSIS — Z5181 Encounter for therapeutic drug level monitoring: Secondary | ICD-10-CM

## 2022-07-04 DIAGNOSIS — I10 Essential (primary) hypertension: Secondary | ICD-10-CM

## 2022-07-04 DIAGNOSIS — E7800 Pure hypercholesterolemia, unspecified: Secondary | ICD-10-CM

## 2022-07-04 DIAGNOSIS — R7301 Impaired fasting glucose: Secondary | ICD-10-CM

## 2022-07-04 NOTE — Telephone Encounter (Signed)
Pt has an upcoming appt on 07/17/2022. He wants to now if labs are going to be taken for that visit and if he should come in prior to for lab appt. Please advise. Pt can be reached at [phone].

## 2022-07-04 NOTE — Telephone Encounter (Signed)
Orders entered. Let him know that I didn't order PSA--I know that he no longer sees urologist, and that I should be checking yearly, but he last had it done in 12/2021, and it is too soon. We can discuss at his visit.

## 2022-07-11 ENCOUNTER — Encounter: Payer: Self-pay | Admitting: Gastroenterology

## 2022-07-11 ENCOUNTER — Ambulatory Visit (AMBULATORY_SURGERY_CENTER): Payer: Medicare HMO | Admitting: Gastroenterology

## 2022-07-11 VITALS — BP 124/80 | HR 75 | Temp 97.3°F | Resp 20 | Ht 64.0 in | Wt 129.0 lb

## 2022-07-11 DIAGNOSIS — D125 Benign neoplasm of sigmoid colon: Secondary | ICD-10-CM

## 2022-07-11 DIAGNOSIS — Z860100 Personal history of colon polyps, unspecified: Secondary | ICD-10-CM

## 2022-07-11 DIAGNOSIS — D120 Benign neoplasm of cecum: Secondary | ICD-10-CM | POA: Diagnosis not present

## 2022-07-11 DIAGNOSIS — I10 Essential (primary) hypertension: Secondary | ICD-10-CM | POA: Diagnosis not present

## 2022-07-11 DIAGNOSIS — Z09 Encounter for follow-up examination after completed treatment for conditions other than malignant neoplasm: Secondary | ICD-10-CM | POA: Diagnosis not present

## 2022-07-11 DIAGNOSIS — I2510 Atherosclerotic heart disease of native coronary artery without angina pectoris: Secondary | ICD-10-CM | POA: Diagnosis not present

## 2022-07-11 DIAGNOSIS — D123 Benign neoplasm of transverse colon: Secondary | ICD-10-CM

## 2022-07-11 DIAGNOSIS — D122 Benign neoplasm of ascending colon: Secondary | ICD-10-CM | POA: Diagnosis not present

## 2022-07-11 DIAGNOSIS — D124 Benign neoplasm of descending colon: Secondary | ICD-10-CM

## 2022-07-11 DIAGNOSIS — K635 Polyp of colon: Secondary | ICD-10-CM | POA: Diagnosis not present

## 2022-07-11 MED ORDER — SODIUM CHLORIDE 0.9 % IV SOLN: 500.0000 mL | Freq: Once | INTRAVENOUS | Status: DC

## 2022-07-11 NOTE — Progress Notes (Signed)
Report to PACU, RN, vss, BBS= Clear.  

## 2022-07-11 NOTE — Patient Instructions (Addendum)
-   Continue present medications.  - Await pathology results.  - Repeat colonoscopy in 3 years for surveillance.   -Follow a high fiber diet. Drink at least 64 ounces of water daily. Add a daily stool bulking agent such as psyllium (an exampled would be Metamucil) Handouts given for diverticulosis, high fiber diet and polyps.  YOU HAD AN ENDOSCOPIC PROCEDURE TODAY AT [facility]:   Refer to the procedure report that was given to you for any specific questions about what was found during the examination.  If the procedure report does not answer your questions, please call your gastroenterologist to clarify.  If you requested that your care partner not be given the details of your procedure findings, then the procedure report has been included in a sealed envelope for you to review at your convenience later. YOU SHOULD EXPECT: Some feelings of bloating in the abdomen. Passage of more gas than usual.  Walking can help get rid of the air that was put into your GI tract during the procedure and reduce the bloating. If you had a lower endoscopy (such as a colonoscopy or flexible sigmoidoscopy) you may notice spotting of blood in your stool or on the toilet paper. If you underwent a bowel prep for your procedure, you may not have a normal bowel movement for a few days. Please Note:  You might notice some irritation and congestion in your nose or some drainage.  This is from the oxygen used during your procedure.  There is no need for concern and it should clear up in a day or so.  SYMPTOMS TO REPORT IMMEDIATELY: Following lower endoscopy (colonoscopy):  Excessive amounts of blood in the stool  Significant tenderness or worsening of abdominal pains  Swelling of the abdomen that is new, acute  Fever of 100F or higher For urgent or emergent issues, a gastroenterologist can be reached at any hour by calling [phone]. Do not use MyChart messaging for urgent concerns.   DIET:  We do  recommend a small meal at first, but then you may proceed to your regular diet.  Drink plenty of fluids but you should avoid alcoholic beverages for 24 hours.  ACTIVITY:  You should plan to take it easy for the rest of today and you should NOT DRIVE or use heavy machinery until tomorrow (because of the sedation medicines used during the test).    FOLLOW UP: Our staff will call the number listed on your records the next business day following your procedure.  We will call around 7:15- 8:00 am to check on you and address any questions or concerns that you may have regarding the information given to you following your procedure. If we do not reach you, we will leave a message.     If any biopsies were taken you will be contacted by phone or by letter within the next 1-3 weeks.  Please call us at [phone] if you have not heard about the biopsies in 3 weeks.    SIGNATURES/CONFIDENTIALITY: You and/or your care partner have signed paperwork which will be entered into your electronic medical record.  These signatures attest to the fact that that the information above on your After Visit Summary has been reviewed and is understood.  Full responsibility of the confidentiality of this discharge information lies with you and/or your care-partner.

## 2022-07-11 NOTE — Progress Notes (Signed)
Called to room to assist during endoscopic procedure.  Patient ID and intended procedure confirmed with present staff. Received instructions for my participation in the procedure from the performing physician.  

## 2022-07-11 NOTE — Op Note (Addendum)
[facility] Patient Name: Gary Blair Procedure Date: 07/11/2022 9:14 AM MRN: [id number] Endoscopist: Thornton Park MD, MD, [id number] Age: 72 Referring MD:  Date of Birth: [birth year] Gender: Male Account #: [id number] Procedure:                Colonoscopy Indications:              Surveillance: Personal history of adenomatous                            polyps on last colonoscopy 5 years ago                           Prior colonoscopies with Dr. Erlene Quan started at age                            29:                           - Colonoscopy 52 years ago with Dr. Erlene Quan showed                            internal hemorrhoids, diverticulosis, and no                            polyps. Given a history of polyps, repeat                            colonoscopy recommended in 5 years.                           - Colonoscopy 8 years ago showed adenomatous polyps                           - Colonoscopy with Dr. Erlene Quan for a personal history                            of adenomatous polyps 07/15/2017 revealed a 5 mm                            ascending colon polyp, a 6 mm transverse colon                            polyp, and moderate left-sided diverticulosis.                            Grade 2 internal hemorrhoids were seen. Pathology                            results not included. Medicines:                Monitored Anesthesia Care Procedure:                Pre-Anesthesia Assessment:                           - Prior to  the procedure, a History and Physical                            was performed, and patient medications and                            allergies were reviewed. The patient's tolerance of                            previous anesthesia was also reviewed. The risks                            and benefits of the procedure and the sedation                            options and risks were discussed with the patient.                            All questions were answered, and  informed consent                            was obtained. Prior Anticoagulants: The patient has                            taken no anticoagulant or antiplatelet agents. ASA                            Grade Assessment: II - A patient with mild systemic                            disease. After reviewing the risks and benefits,                            the patient was deemed in satisfactory condition to                            undergo the procedure.                           After obtaining informed consent, the colonoscope                            was passed under direct vision. Throughout the                            procedure, the patient's blood pressure, pulse, and                            oxygen saturations were monitored continuously. The                            Olympus CF-HQ190L [id number]) Colonoscope was  introduced through the anus and advanced to the 3                            cm into the ileum. A second forward view of the                            right colon was performed. The colonoscopy was                            performed without difficulty. The patient tolerated                            the procedure well. The quality of the bowel                            preparation was good. The terminal ileum, ileocecal                            valve, appendiceal orifice, and rectum were                            photographed. Scope In: 9:23:37 AM Scope Out: 9:56:04 AM Scope Withdrawal Time: 0 hours 22 minutes 12 seconds  Total Procedure Duration: 0 hours 32 minutes 27 seconds  Findings:                 Skin tags were found on perianal exam.                           Multiple medium-mouthed and small-mouthed                            diverticula were found in the entire colon.                           Two sessile polyps were found in the sigmoid colon.                            The polyps were 3 to 4 mm in size. These polyps                             were removed with a cold snare. Resection and                            retrieval were complete. Estimated blood loss was                            minimal.                           A 2 mm polyp was found in the cecum. The polyp was                            sessile. The polyp was removed  with a cold snare.                            Resection and retrieval were complete. Estimated                            blood loss was minimal.                           An 8 mm polyp was found in the ileocecal valve. The                            polyp was flat. The polyp was removed with a                            piecemeal technique using a cold snare. Resection                            and retrieval were complete. Estimated blood loss                            was minimal.                           Three sessile polyps were found in the descending                            colon (1) and splenic flexure (2). The polyps were                            2 to 3 mm in size. These polyps were removed with a                            cold snare. Resection and retrieval were complete.                            Estimated blood loss was minimal.                           Non-bleeding internal hemorrhoids were found. Complications:            No immediate complications. Estimated Blood Loss:     Estimated blood loss was minimal. Impression:               - Perianal skin tags found on perianal exam.                           - Diverticulosis in the entire examined colon.                           - Two 3 to 4 mm polyps in the sigmoid colon,                            removed with a cold snare. Resected and retrieved.                           -  One 2 mm polyp in the cecum, removed with a cold                            snare. Resected and retrieved.                           - One 8 mm polyp at the ileocecal valve, removed                            piecemeal using a cold  snare. Resected and                            retrieved.                           - Three 2 to 3 mm polyps in the descending colon                            and at the splenic flexure, removed with a cold                            snare. Resected and retrieved. Recommendation:           - Patient has a contact number available for                            emergencies. The signs and symptoms of potential                            delayed complications were discussed with the                            patient. Return to normal activities tomorrow.                            Written discharge instructions were provided to the                            patient.                           - Continue present medications.                           - Await pathology results.                           - Repeat colonoscopy in 3 years for surveillance.                           - Follow a high fiber diet. Drink at least 64                            ounces of water daily. Add a daily stool bulking  agent such as psyllium (an exampled would be                            Metamucil).                           - Emerging evidence supports eating a diet of                            fruits, vegetables, grains, calcium, and yogurt                            while reducing red meat and alcohol may reduce the                            risk of colon cancer.                           - Thank you for allowing me to be involved in your                            colon cancer prevention. Thornton Park MD, MD 07/11/2022 10:05:45 AM This report has been signed electronically.

## 2022-07-11 NOTE — Progress Notes (Signed)
Referring Provider: Rita Ohara, MD Primary Care Physician:  Rita Ohara, MD  Indication for Procedure:  Colon cancer Surveillance   IMPRESSION:  Need for colon cancer surveillance Appropriate candidate for monitored anesthesia care  PLAN: Colonoscopy in the [facility] today   HPI: Gary Blair is a 72 y.o. male presents for surveillance colonoscopy.  Prior colonoscopies with Dr. Erlene Quan started at age 59: - Colonoscopy 13 years ago with Dr. Erlene Quan showed internal hemorrhoids, diverticulosis, and no polyps. Given a history of polyps, repeat colonoscopy recommended in 5 years.  - Colonoscopy 8 years ago showed adenomatous polyps - Colonoscopy with Dr. Erlene Quan for a personal history of adenomatous polyps 07/15/2017 revealed a 5 mm ascending colon polyp, a 6 mm transverse colon polyp, and moderate left-sided diverticulosis. Grade 2 internal hemorrhoids were seen. Pathology results not included.    He tolerated the prior procedures well - including prep, procedure, and recovery.    No baseline GI symptoms except for a distant history of hemorrhoids.   Father with colon polyps but he lived to 26. There is no other known family history of colon cancer or polyps. No family history of stomach cancer or other GI malignancy. No family history of inflammatory bowel disease or celiac.      Labs 07/04/21 included a normal CBC Labs 09/12/21 included normal liver enzymes     Past Medical History:  Diagnosis Date   Anal fissure    Arthritis    At risk for sleep apnea    STOP-BANG= 4        SENT TO PCP 10-25-2014   Borderline hyperlipidemia    Carpal tunnel syndrome on both sides    right > left   Coronary artery disease    Diverticulosis of colon    moderate   History of adenomatous polyp of colon    2006,  2010,  tubular adenoma 04-27-2014 due again 03/2017   History of chronic sinusitis    History of colon polyps    History of gastric ulcer    yrs ago   History of gastroesophageal reflux  (GERD)    Hypertension    Prostate cancer ([place]) dx 06/2014   urologist-  Dr. Diona Fanti--  Stage T1c,  Gleason (3+3) 6,  vol. 25.48m   S/P radiation therapy 10/27/2014   Prostate 14,500 cGy implanting 67  I-125 seeds utilizing 31 needles.  Individual seed activity 0.363 mCi Gary seed for a total implant activity of 24.32 mCi.   Wears glasses     Past Surgical History:  Procedure Laterality Date   CARPAL TUNNEL RELEASE Right 06/2016   Dr. WBurney Gauze  COLONOSCOPY  04-27-2014, 06/2017   LUMBAR LAMINECTOMY  1984   thinks L5 (updated 05/2019; prev had documented at cervical laminectomy)   PROSTATE BIOPSY  07/20/14   RADIOACTIVE SEED IMPLANT N/A 10/27/2014   Procedure: RADIOACTIVE SEED IMPLANT/BRACHYTHERAPY IMPLANT;  Surgeon: SFranchot Gallo MD;  [facility]  Service: Urology;  Laterality: N/A;  67 seeds implanted no seeds found in bladder    TONSILLECTOMY  child    Current Outpatient Medications  Medication Sig Dispense Refill   aspirin EC 81 MG tablet Take 81 mg by mouth daily. Swallow whole.     fluticasone (FLONASE) 50 MCG/ACT nasal spray Use 2 spray(s) in each nostril once daily 48 g 3   lisinopril (ZESTRIL) 5 MG tablet Take 1 tablet (5 mg total) by mouth daily. 90 tablet 0   Multiple Vitamins-Minerals (MULTIVITAMIN WITH MINERALS) tablet  Take 1 tablet by mouth daily.     Omega-3 Fatty Acids (FISH OIL) 1200 MG CAPS Take 1 capsule by mouth daily.     rosuvastatin (CRESTOR) 10 MG tablet Take 1 tablet by mouth once daily 90 tablet 2   vitamin C (ASCORBIC ACID) 500 MG tablet Take 500 mg by mouth daily.     Current Facility-Administered Medications  Medication Dose Route Frequency Provider Last Rate Last Admin   0.9 %  sodium chloride infusion  500 mL Intravenous Once [place], MD        Allergies as of 07/11/2022 - Review Complete 07/11/2022  Allergen Reaction Noted   Augmentin [amoxicillin-pot clavulanate] Swelling 10/21/2014   Ciprofloxacin hcl  Other (See Comments) 11/22/2014   Ibuprofen Itching 05/24/2011   Penicillins  05/24/2011   Sulfa antibiotics Rash 05/24/2011    Family History  Problem Relation Age of Onset   Heart attack Mother        deceased age 76   Stroke Father        deceased age 59   Hypertension Father    Prostate cancer Father        prostate in his late 58's   Colon polyps Father    Heart attack Brother    Diabetes Neg Hx    Colon cancer Neg Hx      Physical Exam: General:   Alert,  well-nourished, pleasant and cooperative in NAD Head:  Normocephalic and atraumatic. Eyes:  Sclera clear, no icterus.   Conjunctiva pink. Mouth:  No deformity or lesions.   Neck:  Supple; no masses or thyromegaly. Lungs:  Clear throughout to auscultation.   No wheezes. Heart:  Regular rate and rhythm; no murmurs. Abdomen:  Soft, non-tender, nondistended, normal bowel sounds, no rebound or guarding.  Msk:  Symmetrical. No boney deformities LAD: No inguinal or umbilical LAD Extremities:  No clubbing or edema. Neurologic:  Alert and  oriented x4;  grossly nonfocal Skin:  No obvious rash or bruise. Psych:  Alert and cooperative. Normal mood and affect.     Studies/Results: No results found.    Orlo Brickle L. Tarri Glenn, MD, MPH 07/11/2022, 9:07 AM

## 2022-07-12 ENCOUNTER — Telehealth: Payer: Self-pay | Admitting: *Deleted

## 2022-07-12 NOTE — Telephone Encounter (Signed)
  Follow up Call-     07/11/2022    8:22 AM  Call back number  Post procedure Call Back phone  # [phone]  Permission to leave phone message Yes     Patient questions:  Do you have a fever, pain , or abdominal swelling? No. Pain Score  0 *  Have you tolerated food without any problems? Yes.    Have you been able to return to your normal activities? Yes.    Do you have any questions about your discharge instructions: Diet   No. Medications  No. Follow up visit  No.  Do you have questions or concerns about your Care? No.  Actions: * If pain score is 4 or above: No action needed, pain <4.

## 2022-07-15 ENCOUNTER — Other Ambulatory Visit: Payer: Medicare HMO

## 2022-07-15 DIAGNOSIS — I10 Essential (primary) hypertension: Secondary | ICD-10-CM

## 2022-07-15 DIAGNOSIS — E7800 Pure hypercholesterolemia, unspecified: Secondary | ICD-10-CM | POA: Diagnosis not present

## 2022-07-15 DIAGNOSIS — R7301 Impaired fasting glucose: Secondary | ICD-10-CM

## 2022-07-15 DIAGNOSIS — Z5181 Encounter for therapeutic drug level monitoring: Secondary | ICD-10-CM | POA: Diagnosis not present

## 2022-07-16 LAB — CBC WITH DIFFERENTIAL/PLATELET
Basophils Absolute: 0 10*3/uL (ref 0.0–0.2)
Basos: 1 %
EOS (ABSOLUTE): 0.3 10*3/uL (ref 0.0–0.4)
Eos: 5 %
Hematocrit: 42.2 % (ref 37.5–51.0)
Hemoglobin: 14.6 g/dL (ref 13.0–17.7)
Immature Grans (Abs): 0 10*3/uL (ref 0.0–0.1)
Immature Granulocytes: 0 %
Lymphocytes Absolute: 1.4 10*3/uL (ref 0.7–3.1)
Lymphs: 28 %
MCH: 31.5 pg (ref 26.6–33.0)
MCHC: 34.6 g/dL (ref 31.5–35.7)
MCV: 91 fL (ref 79–97)
Monocytes Absolute: 0.6 10*3/uL (ref 0.1–0.9)
Monocytes: 12 %
Neutrophils Absolute: 2.7 10*3/uL (ref 1.4–7.0)
Neutrophils: 54 %
Platelets: 176 10*3/uL (ref 150–450)
RBC: 4.64 x10E6/uL (ref 4.14–5.80)
RDW: 12.1 % (ref 11.6–15.4)
WBC: 4.9 10*3/uL (ref 3.4–10.8)

## 2022-07-16 LAB — HEMOGLOBIN A1C
Est. average glucose Bld gHb Est-mCnc: 123 mg/dL
Hgb A1c MFr Bld: 5.9 % — ABNORMAL HIGH (ref 4.8–5.6)

## 2022-07-16 LAB — COMPREHENSIVE METABOLIC PANEL
ALT: 22 IU/L (ref 0–44)
AST: 27 IU/L (ref 0–40)
Albumin/Globulin Ratio: 2 (ref 1.2–2.2)
Albumin: 4.2 g/dL (ref 3.8–4.8)
Alkaline Phosphatase: 67 IU/L (ref 44–121)
BUN/Creatinine Ratio: 17 (ref 10–24)
BUN: 17 mg/dL (ref 8–27)
Bilirubin Total: 0.4 mg/dL (ref 0.0–1.2)
CO2: 21 mmol/L (ref 20–29)
Calcium: 9.3 mg/dL (ref 8.6–10.2)
Chloride: 106 mmol/L (ref 96–106)
Creatinine, Ser: 1.03 mg/dL (ref 0.76–1.27)
Globulin, Total: 2.1 g/dL (ref 1.5–4.5)
Glucose: 97 mg/dL (ref 70–99)
Potassium: 5.1 mmol/L (ref 3.5–5.2)
Sodium: 141 mmol/L (ref 134–144)
Total Protein: 6.3 g/dL (ref 6.0–8.5)
eGFR: 77 mL/min/{1.73_m2} (ref >59)

## 2022-07-16 LAB — LIPID PANEL
Chol/HDL Ratio: 2.2 ratio (ref 0.0–5.0)
Cholesterol, Total: 177 mg/dL (ref 100–199)
HDL: 81 mg/dL (ref >39)
LDL Chol Calc (NIH): 83 mg/dL (ref 0–99)
Triglycerides: 66 mg/dL (ref 0–149)
VLDL Cholesterol Cal: 13 mg/dL (ref 5–40)

## 2022-07-16 NOTE — Progress Notes (Unsigned)
No chief complaint on file.  Gary Blair is a 72 y.o. male who presents for annual physical exam, Medicare wellness visit and follow-up on chronic medical conditions.  See below for labs done prior to his visit. He has the following concerns:  Hypertension.  He is compliant with taking lisinopril daily. Some cough related to PND (which was present prior to starting the ACEI), and sometimes related to what he eats (ie popcorn, peanuts). This has improved some. He has been trying to limit the sodium in his diet. His lisinopril dose was decreased to 5mg  last year, due to lower BP's.  BP's have been running   Denies headaches,  palpitations, edema, shortness of breath exertional chest pain. He reports some dizziness with position changes, only noticed at the gym.  BP Readings from Last 3 Encounters:  07/11/22 124/80  06/12/22 (!) 148/78  01/16/22 139/83   He had reported some atypical chest pain at his physical last year. That, along with his intermittently elevated cholesterol readings, was reason for doing Calcium score, which was very high.  He was referred to cardiology, underwent coronary CTA. This showed 50-69% mid RCA stenosis.  CT FFR was performed, suggesting nonobstructive CAD. He was started on 10mg  of rosuvastatin and aspirin (by cardiologist). Denies any side effects.  Goal LDL <50 per Dr. Kathalene Frames notes. LDL after starting medication was 54. See below for recent labs.  He reports compliance with statin, denies side effects. He tries to follow low cholesterol diet. He has read meat 1-2x/week, 2 eggs/week.   +pimiento cheese and low sugar ice cream daily.  Rare creamy soups/sauces/dressings, not much better, no fried foods.  Chronic/intermittent cough/congestion--some days doesn't have a problem, other days has coughing spells 2x/day, this isn't frequent.  He uses Flonase every night, which has improved symptoms. He gets a coughing spell about 3x/week. Sometimes related to  eating peanuts too fast, feels like something gets stuck in his throat. In the morning he gets up clear mucus/phlegm.  He doesn't really find this too bothersome today.  Impaired fasting glucose: he doesn't check sugars.  He tries to limit sugar in his diet (candy, sweets, no regular sodas or sweet tea--very rare).   He eats sugar-free ice cream (most of the time).  Alcohol--average of 1 drink per day (usually beer, occasionally a shot of bourbon), maybe 2-3 on a weekend, total of up to 10-12/week.  Component Ref Range & Units 1 yr ago (07/04/21) 1 yr ago (12/21/20) 2 yr ago (06/08/20) 3 yr ago (05/31/19) 4 yr ago (05/27/18) 5 yr ago (05/26/17) 6 yr ago (04/17/16)  Hgb A1c MFr Bld 4.8 - 5.6 % 5.8 High  5.7 Abnormal  R 6.0 High  CM 5.5 R 5.7 High  CM 5.5 R 5.6 R, CM    Prostate Cancer:  He is s/p brachytherapy 09/2014.  He last saw urologist in 12/2021, doing well, PSA remained low. He denies any significant urinary complaints (slight hesitancy and weakened stream, unchanged). Up 0-1 times/night to void. He was released from the care of urology in 12/2021, advised PCP can check yearly PSA. He has ED and continues to note very little ejaculate.  Immunization History  Administered Date(s) Administered   Influenza Split 03/16/2005, 02/15/2006, 02/01/2007, 02/25/2008, 04/20/2009, 01/28/2012   Influenza Whole 02/09/2013   Influenza, High Dose Seasonal PF 01/23/2016, 01/27/2017, 01/04/2018, 12/20/2018, 12/29/2019, 01/01/2022   Influenza,trivalent, recombinat, inj, PF 01/08/2014   Influenza-Unspecified 01/01/2015, 01/04/2018, 12/28/2020   PFIZER(Purple Top)SARS-COV-2 Vaccination 06/05/2019, 06/30/2019, 02/01/2020, 09/04/2020  Pension scheme manager 46yrs & up 01/19/2021   Pneumococcal Conjugate-13 04/11/2014   Pneumococcal Polysaccharide-23 12/14/2004, 04/17/2015   Respiratory Syncytial Virus Vaccine,Recomb Aduvanted(Arexvy) 01/01/2022   Td 06/27/1989, 09/30/2001   Tdap 05/20/2015    Unspecified SARS-COV-2 Vaccination 02/06/2022   Zoster Recombinat (Shingrix) 05/30/2020, 07/29/2020   Zoster, Live 04/26/2011   Last colonoscopy: 07/11/22 (Dr. Tarri Glenn), +polyps, path still pending. Diverticulosis. Last PSA: per urologist, UTD (12/2021, result not received) Dentist: twice yearly   Ophtho: yearly Exercise:  Going to [employer] 4-5x/week (cardio x 30-50 mins, plus weight machines)  Patient Care Team: Rita Ohara, MD as PCP - General (Family Medicine) Dr. Salli Real as Consulting Physician (Dentistry) Marygrace Drought, MD as Consulting Physician (Ophthalmology) Franchot Gallo, MD as Consulting Physician (Urology) Thurman Coyer, DO as Consulting Physician (Sports Medicine) Ulla Gallo, MD as Consulting Physician (Dermatology) GI:  Dr. Tarri Glenn (Radiation onc: Dr. Valere Dross)  Depression Screening: [place] Office Visit from 07/05/2021 in [place]  PHQ-2 Total Score 0        Falls screen:     08/16/2021    2:25 PM 07/05/2021    1:42 PM 12/21/2020   10:41 AM 06/08/2020    8:32 AM 12/06/2019   10:40 AM  [place] in the past year? 0 0 0 0 0  Number falls in past yr: 0 0 0    Injury with Fall? 0 0 0    Risk for fall due to : No Fall Risks No Fall Risks No Fall Risks    Follow up Falls evaluation completed Falls evaluation completed Falls evaluation completed       Functional Status Survey:        End of Life Discussion:  Patient has a living will and medical power of attorney (and brought in 03/2015)    PMH, [place], [place] and FH were reviewed and updated    ROS: The patient denies fever, chills, Denies headaches, vision loss, ear pain, palpitations, dizziness, syncope, dyspnea on exertion, swelling, constipation, melena, hematochezia, hematuria, genital lesions, weakness, tremor, suspicious skin lesions (sees derm regularly); denies depression, anxiety, abnormal bleeding/bruising, or enlarged lymph nodes.   He has carpal  tunnel on the left, wears brace to sleep at night, which helps. He has some back stiffness in the morning, only rare/minor knee pain.  Has some pain in his wrists, which he says is arthritis (right) Chronic PND improved with Flonase  Some hesitancy and weakened stream, unchanged.  No nocturia or incontinence. +ED.  Ear plugging occasionally, mainly on the R.  Chest pain?   PHYSICAL EXAM:  There were no vitals taken for this visit.  Wt Readings from Last 3 Encounters:  07/11/22 129 lb (58.5 kg)  06/12/22 129 lb (58.5 kg)  01/16/22 127 lb 9.6 oz (57.9 kg)     General Appearance:    Alert, cooperative, no distress, appears stated age     Head:    Normocephalic, without obvious abnormality, atraumatic     Eyes:    PERRL, conjunctiva/corneas clear, EOM's intact, fundi benign     Ears:    Normal TM's and external ear canals    Nose:    No drainage or sinus tenderness  Throat:    Normal mucosa  Neck:    Supple, no lymphadenopathy; thyroid: no enlargement/tenderness/nodules; no carotid bruit or JVD.    Back:    Spine nontender, no curvature, ROM normal, no CVA tenderness     Lungs:  Clear to auscultation bilaterally without wheezes, rales or ronchi; respirations unlabored     Chest Wall:    No tenderness or deformity     Heart:    Regular rate and rhythm, S1 and S2 normal, no murmur, rub or gallop     Breast Exam:    No chest wall tenderness, masses or gynecomastia     Abdomen:    Soft, non-tender, nondistended, normoactive bowel sounds, no masses, no hepatosplenomegaly.   Genitalia:    Deferred to urologist  Rectal:    Deferred to urologist.   Extremities:    No clubbing, cyanosis or edema.  Pulses:    2+ and symmetric all extremities     Skin:    Skin color, texture, turgor normal, no rashes.  Lymph nodes:    Cervical, supraclavicular, and inguinal nodes normal     Neurologic:    Normal strength, sensation and gait; reflexes 2+ and symmetric throughout                         Psych:    Normal mood, affect, hygiene and grooming  ***UPDATE_-any senile purpura??   Lab Results  Component Value Date   HGBA1C 5.9 (H) 07/15/2022   Fasting glucose 97    Chemistry      Component Value Date/Time   NA 141 07/15/2022 0818   K 5.1 07/15/2022 0818   CL 106 07/15/2022 0818   CO2 21 07/15/2022 0818   BUN 17 07/15/2022 0818   CREATININE 1.03 07/15/2022 0818   CREATININE 0.96 04/17/2016 0844      Component Value Date/Time   CALCIUM 9.3 07/15/2022 0818   ALKPHOS 67 07/15/2022 0818   AST 27 07/15/2022 0818   ALT 22 07/15/2022 0818   BILITOT 0.4 07/15/2022 0818     Lab Results  Component Value Date   WBC 4.9 07/15/2022   HGB 14.6 07/15/2022   HCT 42.2 07/15/2022   MCV 91 07/15/2022   PLT 176 07/15/2022   Lab Results  Component Value Date   CHOL 177 07/15/2022   HDL 81 07/15/2022   LDLCALC 83 07/15/2022   TRIG 66 07/15/2022   CHOLHDL 2.2 07/15/2022      ASSESSMENT/PLAN:  COVID booster [id number] (last year told not needed, guidelines changed, is rec if >5 years from last pneumonia shots)  PSA is due in September Discuss/offer lung cancer screening/referral  Remove senile purpura if none present ***  Recommended at least 30 minutes of aerobic activity at least 5 days/week, weight-bearing exercise at least 2x/week; proper sunscreen use reviewed; healthy diet and alcohol recommendations (less than or equal to 2 drinks/day) reviewed; regular seatbelt use; changing batteries in smoke detectors. Immunization recommendations discussed--continue yearly flu shots.  COVID booster [id number] Colonoscopy recommendations reviewed, UTD, pathology pending from colonoscopy last week.  Lung cancer screening was reviewed (previously not eligible due to the "years since quitting" requirement, which was eliminated with recently updated guidelines.)  MOST form reviewed and signed. Full Code, Full Care.  F/u 1 year for CPE/AWV   Medicare Attestation I have  personally reviewed: The patient's medical and social history Their use of alcohol, tobacco or illicit drugs Their current medications and supplements The patient's functional ability including ADLs,fall risks, home safety risks, cognitive, and hearing and visual impairment Diet and physical activities Evidence for depression or mood disorders  The patient's weight, height, BMI have been recorded in the chart.  I have made referrals, counseling, and provided education  to the patient based on review of the above and I have provided the patient with a written personalized care plan for preventive services.

## 2022-07-16 NOTE — Patient Instructions (Incomplete)
HEALTH MAINTENANCE RECOMMENDATIONS:  It is recommended that you get at least 30 minutes of aerobic exercise at least 5 days/week (for weight loss, you may need as much as 60-90 minutes). This can be any activity that gets your heart rate up. This can be divided in 10-15 minute intervals if needed, but try and build up your endurance at least once a week.  Weight bearing exercise is also recommended twice weekly.  Eat a healthy diet with lots of vegetables, fruits and fiber.  "Colorful" foods have a lot of vitamins (ie green vegetables, tomatoes, red peppers, etc).  Limit sweet tea, regular sodas and alcoholic beverages, all of which has a lot of calories and sugar.  Up to 2 alcoholic drinks daily may be beneficial for men (unless trying to lose weight, watch sugars).  Drink a lot of water.  Sunscreen of at least SPF 30 should be used on all sun-exposed parts of the skin when outside between the hours of 10 am and 4 pm (not just when at beach or pool, but even with exercise, golf, tennis, and yard work!)  Use a sunscreen that says "broad spectrum" so it covers both UVA and UVB rays, and make sure to reapply every 1-2 hours.  Remember to change the batteries in your smoke detectors when changing your clock times in the spring and fall.  Carbon monoxide detectors are recommended for your home.  Use your seat belt every time you are in a car, and please drive safely and not be distracted with cell phones and texting while driving.    Gary Blair , Thank you for taking time to come for your Medicare Wellness Visit. I appreciate your ongoing commitment to your health goals. Please review the following plan we discussed and let me know if I can assist you in the future.   This is a list of the screening recommended for you and due dates:  Health Maintenance  Topic Date Due   COVID-19 Vaccine (7 - 2023-24 season) 04/03/2022   Screening for Lung Cancer  08/03/2022   Medicare Annual Wellness Visit   07/17/2023   DTaP/Tdap/Td vaccine (4 - Td or Tdap) 05/19/2025   Colon Cancer Screening  07/10/2025   Pneumonia Vaccine  Completed   Flu Shot  Completed   Hepatitis C Screening: USPSTF Recommendation to screen - Ages 18-79 yo.  Completed   Zoster (Shingles) Vaccine  Completed   HPV Vaccine  Aged Out   The above colonoscopy date may not be correct--the pathology from your recent colonoscopy is still pending, which will affect the recommended date.  Today we discussed the potential for lung cancer screening, due to your history of smoking (previously not eligible due to them having a "years since quitting" requirement, which was eliminated with recently updated guidelines.) We put in a referral for this.  COVID booster is now recommended again for people over 65 (booster with the same vaccine you had in October).  You can get this at the pharmacy, if desired.  Wait at least 2 weeks from today's vaccine.  [id number] was given today.  Pre-diabetes--be sure to keep your sweets, carbs and alcohol intake low.  Eat more whole grains.  Use nonfat or lowfat dairy. Limit your portions of bread, pasta, rice, potatoes (white foods).  We are increasing your rosuvastatin dose to 20mg .  A new prescription was sent to your pharmacy.  You may finish up the 10 mg tablets that you have by taking 2 pills together.  Try using some Debrox drops to your right ear.  If your hearing doesn't improve, schedule an office visit and we can remove the wax.

## 2022-07-17 ENCOUNTER — Encounter: Payer: Self-pay | Admitting: Family Medicine

## 2022-07-17 ENCOUNTER — Ambulatory Visit (INDEPENDENT_AMBULATORY_CARE_PROVIDER_SITE_OTHER): Payer: Medicare HMO | Admitting: Family Medicine

## 2022-07-17 VITALS — BP 144/80 | HR 84 | Ht 63.0 in | Wt 126.8 lb

## 2022-07-17 DIAGNOSIS — I700 Atherosclerosis of aorta: Secondary | ICD-10-CM | POA: Diagnosis not present

## 2022-07-17 DIAGNOSIS — I2510 Atherosclerotic heart disease of native coronary artery without angina pectoris: Secondary | ICD-10-CM

## 2022-07-17 DIAGNOSIS — E7800 Pure hypercholesterolemia, unspecified: Secondary | ICD-10-CM | POA: Diagnosis not present

## 2022-07-17 DIAGNOSIS — Z23 Encounter for immunization: Secondary | ICD-10-CM | POA: Diagnosis not present

## 2022-07-17 DIAGNOSIS — I10 Essential (primary) hypertension: Secondary | ICD-10-CM

## 2022-07-17 DIAGNOSIS — Z87891 Personal history of nicotine dependence: Secondary | ICD-10-CM

## 2022-07-17 DIAGNOSIS — Z5181 Encounter for therapeutic drug level monitoring: Secondary | ICD-10-CM | POA: Diagnosis not present

## 2022-07-17 DIAGNOSIS — Z122 Encounter for screening for malignant neoplasm of respiratory organs: Secondary | ICD-10-CM

## 2022-07-17 DIAGNOSIS — R7301 Impaired fasting glucose: Secondary | ICD-10-CM

## 2022-07-17 DIAGNOSIS — Z0000 Encounter for general adult medical examination without abnormal findings: Secondary | ICD-10-CM | POA: Diagnosis not present

## 2022-07-17 DIAGNOSIS — H6121 Impacted cerumen, right ear: Secondary | ICD-10-CM

## 2022-07-17 DIAGNOSIS — J3089 Other allergic rhinitis: Secondary | ICD-10-CM

## 2022-07-17 DIAGNOSIS — D692 Other nonthrombocytopenic purpura: Secondary | ICD-10-CM | POA: Diagnosis not present

## 2022-07-17 MED ORDER — ROSUVASTATIN CALCIUM 20 MG PO TABS: 20.0000 mg | ORAL_TABLET | Freq: Every day | ORAL | 0 refills | Status: DC

## 2022-07-18 ENCOUNTER — Encounter: Payer: Self-pay | Admitting: Gastroenterology

## 2022-07-22 ENCOUNTER — Telehealth: Payer: Self-pay | Admitting: *Deleted

## 2022-07-22 NOTE — Telephone Encounter (Signed)
Patient left a message on my voicemail just as FYI for you that he took his bp that morning at home and it was 127/69.

## 2022-07-30 ENCOUNTER — Other Ambulatory Visit: Payer: Self-pay | Admitting: *Deleted

## 2022-07-30 DIAGNOSIS — Z122 Encounter for screening for malignant neoplasm of respiratory organs: Secondary | ICD-10-CM

## 2022-07-30 DIAGNOSIS — Z87891 Personal history of nicotine dependence: Secondary | ICD-10-CM

## 2022-08-07 ENCOUNTER — Encounter: Payer: Self-pay | Admitting: Family Medicine

## 2022-08-07 ENCOUNTER — Ambulatory Visit (INDEPENDENT_AMBULATORY_CARE_PROVIDER_SITE_OTHER): Payer: Medicare HMO | Admitting: Family Medicine

## 2022-08-07 VITALS — BP 128/70 | HR 80 | Ht 63.0 in | Wt 126.2 lb

## 2022-08-07 DIAGNOSIS — H6121 Impacted cerumen, right ear: Secondary | ICD-10-CM

## 2022-08-07 NOTE — Progress Notes (Signed)
Chief Complaint  Patient presents with   Cerumen Impaction    Thinks he has some was in his right ear that he would like cleaned out.    Patient presents for wax removal.  At his CPE on 3/20, he had noted decreased hearing in R ear, followed by the left.  It had improved somewhat, but wasn't back to baseline. On exam, was noted to have cerumen impaction on the right.   He started debrox drops a few days ago, hearing got worse, so he called to schedule a visit. He denies any ear pain.  He has chronic sinus issues, unchanged/stable.  PMH, PSH, SH reviewed  Outpatient Encounter Medications as of 08/07/2022  Medication Sig   aspirin EC 81 MG tablet Take 81 mg by mouth daily. Swallow whole.   fluticasone (FLONASE) 50 MCG/ACT nasal spray Use 2 spray(s) in each nostril once daily   lisinopril (ZESTRIL) 5 MG tablet Take 1 tablet (5 mg total) by mouth daily.   Multiple Vitamins-Minerals (MULTIVITAMIN WITH MINERALS) tablet Take 1 tablet by mouth daily.   Omega-3 Fatty Acids (FISH OIL) 1200 MG CAPS Take 1 capsule by mouth daily.   rosuvastatin (CRESTOR) 20 MG tablet Take 1 tablet (20 mg total) by mouth daily.   vitamin C (ASCORBIC ACID) 500 MG tablet Take 500 mg by mouth daily.   No facility-administered encounter medications on file as of 08/07/2022.   Allergies  Allergen Reactions   Augmentin [Amoxicillin-Pot Clavulanate] Swelling   Ciprofloxacin Hcl Other (See Comments)    Stated reddened skin   Ibuprofen Itching   Penicillins     Pruritis, joint pains,throat swelling when he took Augmentin   Sulfa Antibiotics Rash   ROS: No fever, chills, cough, shortness of breath or wheezing, n/v/d, rashes, bleeding, bruising Allergies chronic, unchanged, no purulence or sinus pain.  No other concerns.   PHYSICAL EXAM:  BP 128/70   Pulse 80   Ht 5\' 3"  (1.6 m)   Wt 126 lb 3.2 oz (57.2 kg)   BMI 22.36 kg/m   Wt Readings from Last 3 Encounters:  08/07/22 126 lb 3.2 oz (57.2 kg)  07/17/22 126  lb 12.8 oz (57.5 kg)  07/11/22 129 lb (58.5 kg)   Pleasant, well-appearing male in no distress HEENT: conjunctiva and sclera are clear, EOMI Nasal mucosa --mild edema with some clear mucus. Septal deviation, narrower on the right. Sinuses nontender.  OP is clear. Ears: R--initially with cerumen impactoin.  After lavage--some fluid present in canal, wax clear, normal TM L TM and EAC normal. Neck: No cervical lymphadenopathy Heart: regular rate and rhythm Lungs: clear bilaterally Extremities: no edema Psych: normal mood, affect, hygiene and grooming  ASSESSMENT/PLAN:  Impacted cerumen of right ear - resolved with lavage  Hearing loss of right ear due to cerumen impaction - minimal improvement in hearing initially.  Used alcohol drop, and noted some improvement in hearing. Reassured, to give more time as fluid dries up.ENT if worse   The wax was removed from the right ear.  The eardrum appeared normal.  We put a drop of alcohol in to help dry up some of the water that might still be affecting your hearing. If your hearing doesn't start to improve soon, let us know and we can refer you to an ENT doctor.

## 2022-08-07 NOTE — Patient Instructions (Signed)
  The wax was removed from the right ear.  The eardrum appeared normal.  We put a drop of alcohol in to help dry up some of the water that might still be affecting your hearing. If your hearing doesn't start to improve soon, let us know and we can refer you to an ENT doctor.

## 2022-08-15 DIAGNOSIS — M79641 Pain in right hand: Secondary | ICD-10-CM | POA: Diagnosis not present

## 2022-08-15 DIAGNOSIS — M79642 Pain in left hand: Secondary | ICD-10-CM | POA: Diagnosis not present

## 2022-08-21 ENCOUNTER — Encounter: Payer: Self-pay | Admitting: Acute Care

## 2022-08-21 ENCOUNTER — Ambulatory Visit (INDEPENDENT_AMBULATORY_CARE_PROVIDER_SITE_OTHER): Payer: Medicare HMO | Admitting: Acute Care

## 2022-08-21 DIAGNOSIS — Z87891 Personal history of nicotine dependence: Secondary | ICD-10-CM

## 2022-08-21 NOTE — Progress Notes (Signed)
Virtual Visit via Telephone Note  I connected with Charlsie Quest on 08/21/22 at  9:00 AM EDT by telephone and verified that I am speaking with the correct person using two identifiers.  Location: Patient:  At home Provider: [address] [place], [place], [address]    I discussed the limitations, risks, security and privacy concerns of performing an evaluation and management service by telephone and the availability of in person appointments. I also discussed with the patient that there may be a patient responsible charge related to this service. The patient expressed understanding and agreed to proceed.   Shared Decision Making Visit Lung Cancer Screening Program [phone])   Eligibility: Age 72 y.o. Pack Years Smoking History Calculation 34 pack year smoking history (# packs/per year x # years smoked) Recent History of coughing up blood  no Unexplained weight loss? no ( >Than 15 pounds within the last 6 months ) Prior History Lung / other cancer no (Diagnosis within the last 5 years already requiring surveillance chest CT Scans). Smoking Status Former Smoker Former Smokers: Years since quit: 10 years  Quit Date: 2014  Visit Components: Discussion included one or more decision making aids.  NA Discussion included risk/benefits of screening. yes Discussion included potential follow up diagnostic testing for abnormal scans. yes Discussion included meaning and risk of over diagnosis. yes Discussion included meaning and risk of False Positives. yes Discussion included meaning of total radiation exposure. yes  Counseling Included: Importance of adherence to annual lung cancer LDCT screening. yes Impact of comorbidities on ability to participate in the program. yes Ability and willingness to under diagnostic treatment. yes  Smoking Cessation Counseling: Current Smokers:  Discussed importance of smoking cessation. yes Information about tobacco cessation classes and  interventions provided to patient. yes Patient provided with "ticket" for LDCT Scan. yes Symptomatic Patient. no  Counseling NA Diagnosis Code: Tobacco Use Z72.0 Asymptomatic Patient yes  Counseling (Intermediate counseling: > three minutes counseling) [id number] Former Smokers:  Discussed the importance of maintaining cigarette abstinence. yes Diagnosis Code: Personal History of Nicotine Dependence. Q46.962 Information about tobacco cessation classes and interventions provided to patient. Yes Patient provided with "ticket" for LDCT Scan. yes Written Order for Lung Cancer Screening with LDCT placed in Epic. Yes (CT Chest Lung Cancer Screening Low Dose W/O CM) [id number] Z12.2-Screening of respiratory organs Z87.891-Personal history of nicotine dependence  I spent 25 minutes of face to face time/virtual visit time  with  Mr. Verdi discussing the risks and benefits of lung cancer screening. We took the time to pause the power point at intervals to allow for questions to be asked and answered to ensure understanding. We discussed that he had taken the single most powerful action possible to decrease his risk of developing lung cancer when he quit smoking. I counseled him to remain smoke free, and to contact me if he ever had the desire to smoke again so that I can provide resources and tools to help support the effort to remain smoke free. We discussed the time and location of the scan, and that either  Abigail Miyamoto RN, Karlton Lemon, RN or I  or I will call / send a letter with the results within  24-72 hours of receiving them. He has the office contact information in the event he needs to speak with me,  he verbalized understanding of all of the above and had no further questions upon leaving the office.     I explained to the patient that there  has been a high incidence of coronary artery disease noted on these exams. I explained that this is a non-gated exam therefore degree or severity cannot be  determined. This patient is on statin therapy. I have asked the patient to follow-up with their PCP regarding any incidental finding of coronary artery disease and management with diet or medication as they feel is clinically indicated. The patient verbalized understanding of the above and had no further questions.     Bevelyn Ngo, NP 08/21/2022

## 2022-08-21 NOTE — Patient Instructions (Signed)
Thank you for participating in the [facility]. It was our pleasure to meet you today. We will call you with the results of your scan within the next few days. Your scan will be assigned a Lung RADS category score by the physicians reading the scans.  This Lung RADS score determines follow up scanning.  See below for description of categories, and follow up screening recommendations. We will be in touch to schedule your follow up screening annually or based on recommendations of our providers. We will fax a copy of your scan results to your Primary Care Physician, or the physician who referred you to the program, to ensure they have the results. Please call the office if you have any questions or concerns regarding your scanning experience or results.  Our office number is [phone]. Please speak with Denise Phelps, RN. , or  Denise Buckner RN, They are  our Lung Cancer Screening RN.'s If They are unavailable when you call, Please leave a message on the voice mail. We will return your call at our earliest convenience.This voice mail is monitored several times a day.  Remember, if your scan is normal, we will scan you annually as long as you continue to meet the criteria for the program. (Age 50-80, Current smoker or smoker who has quit within the last 15 years). If you are a smoker, remember, quitting is the single most powerful action that you can take to decrease your risk of lung cancer and other pulmonary, breathing related problems. We know quitting is hard, and we are here to help.  Please let us know if there is anything we can do to help you meet your goal of quitting. If you are a former smoker, congratulations. We are proud of you! Remain smoke free! Remember you can refer friends or family members through the number above.  We will screen them to make sure they meet criteria for the program. Thank you for helping us take better care of you by  participating in Lung Screening.  You can receive free nicotine replacement therapy ( patches, gum or mints) by calling 1-800-QUIT NOW. Please call so we can get you on the path to becoming  a non-smoker. I know it is hard, but you can do this!  Lung RADS Categories:  Lung RADS 1: no nodules or definitely non-concerning nodules.  Recommendation is for a repeat annual scan in 12 months.  Lung RADS 2:  nodules that are non-concerning in appearance and behavior with a very low likelihood of becoming an active cancer. Recommendation is for a repeat annual scan in 12 months.  Lung RADS 3: nodules that are probably non-concerning , includes nodules with a low likelihood of becoming an active cancer.  Recommendation is for a 6-month repeat screening scan. Often noted after an upper respiratory illness. We will be in touch to make sure you have no questions, and to schedule your 6-month scan.  Lung RADS 4 A: nodules with concerning findings, recommendation is most often for a follow up scan in 3 months or additional testing based on our provider's assessment of the scan. We will be in touch to make sure you have no questions and to schedule the recommended 3 month follow up scan.  Lung RADS 4 B:  indicates findings that are concerning. We will be in touch with you to schedule additional diagnostic testing based on our provider's  assessment of the scan.  Other options for assistance in smoking cessation (   As covered by your insurance benefits)  Hypnosis for smoking cessation  [employer] [phone]  Acupuncture for smoking cessation  [facility] [phone]   

## 2022-08-22 ENCOUNTER — Ambulatory Visit (HOSPITAL_BASED_OUTPATIENT_CLINIC_OR_DEPARTMENT_OTHER)
Admission: RE | Admit: 2022-08-22 | Discharge: 2022-08-22 | Disposition: A | Discharge status: Home / Self Care | Payer: Medicare HMO | Source: Ambulatory Visit | Attending: Acute Care | Admitting: Acute Care

## 2022-08-22 DIAGNOSIS — Z87891 Personal history of nicotine dependence: Secondary | ICD-10-CM | POA: Diagnosis not present

## 2022-08-22 DIAGNOSIS — Z122 Encounter for screening for malignant neoplasm of respiratory organs: Secondary | ICD-10-CM | POA: Diagnosis not present

## 2022-08-27 ENCOUNTER — Other Ambulatory Visit: Payer: Self-pay

## 2022-08-27 DIAGNOSIS — Z87891 Personal history of nicotine dependence: Secondary | ICD-10-CM

## 2022-08-27 DIAGNOSIS — Z122 Encounter for screening for malignant neoplasm of respiratory organs: Secondary | ICD-10-CM

## 2022-08-29 ENCOUNTER — Other Ambulatory Visit: Payer: Self-pay | Admitting: Family Medicine

## 2022-08-29 DIAGNOSIS — J3089 Other allergic rhinitis: Secondary | ICD-10-CM

## 2022-09-03 DIAGNOSIS — M79642 Pain in left hand: Secondary | ICD-10-CM | POA: Diagnosis not present

## 2022-09-03 DIAGNOSIS — M79641 Pain in right hand: Secondary | ICD-10-CM | POA: Diagnosis not present

## 2022-10-02 ENCOUNTER — Other Ambulatory Visit: Payer: Medicare HMO

## 2022-10-02 DIAGNOSIS — Z5181 Encounter for therapeutic drug level monitoring: Secondary | ICD-10-CM

## 2022-10-02 DIAGNOSIS — E7800 Pure hypercholesterolemia, unspecified: Secondary | ICD-10-CM | POA: Diagnosis not present

## 2022-10-02 LAB — HEPATIC FUNCTION PANEL
AST: 31 IU/L (ref 0–40)
Alkaline Phosphatase: 67 IU/L (ref 44–121)

## 2022-10-02 LAB — LIPID PANEL: Triglycerides: 60 mg/dL (ref 0–149)

## 2022-10-03 ENCOUNTER — Other Ambulatory Visit: Payer: Self-pay | Admitting: *Deleted

## 2022-10-03 DIAGNOSIS — E7800 Pure hypercholesterolemia, unspecified: Secondary | ICD-10-CM

## 2022-10-03 DIAGNOSIS — I700 Atherosclerosis of aorta: Secondary | ICD-10-CM

## 2022-10-03 LAB — LIPID PANEL
Chol/HDL Ratio: 1.9 ratio (ref 0.0–5.0)
Cholesterol, Total: 147 mg/dL (ref 100–199)
HDL: 76 mg/dL (ref >39)
LDL Chol Calc (NIH): 59 mg/dL (ref 0–99)
VLDL Cholesterol Cal: 12 mg/dL (ref 5–40)

## 2022-10-03 LAB — HEPATIC FUNCTION PANEL
ALT: 31 IU/L (ref 0–44)
Albumin: 4.2 g/dL (ref 3.8–4.8)
Bilirubin Total: 0.8 mg/dL (ref 0.0–1.2)
Bilirubin, Direct: 0.24 mg/dL (ref 0.00–0.40)
Total Protein: 6.2 g/dL (ref 6.0–8.5)

## 2022-10-03 MED ORDER — ROSUVASTATIN CALCIUM 20 MG PO TABS
20.0000 mg | ORAL_TABLET | Freq: Every day | ORAL | 2 refills | Status: DC
Start: 2022-10-03 — End: 2023-08-11

## 2022-10-07 ENCOUNTER — Other Ambulatory Visit: Payer: Self-pay | Admitting: Family Medicine

## 2022-10-07 DIAGNOSIS — I10 Essential (primary) hypertension: Secondary | ICD-10-CM

## 2022-11-11 DIAGNOSIS — D485 Neoplasm of uncertain behavior of skin: Secondary | ICD-10-CM | POA: Diagnosis not present

## 2022-11-11 DIAGNOSIS — Z85828 Personal history of other malignant neoplasm of skin: Secondary | ICD-10-CM | POA: Diagnosis not present

## 2022-11-11 DIAGNOSIS — L819 Disorder of pigmentation, unspecified: Secondary | ICD-10-CM | POA: Diagnosis not present

## 2022-11-11 DIAGNOSIS — L570 Actinic keratosis: Secondary | ICD-10-CM | POA: Diagnosis not present

## 2022-12-17 NOTE — Progress Notes (Unsigned)
No chief complaint on file.     Colonoscopy 06/2022 with Dr. Orvan Falconer. Diverticulosis and multiple polyps found, which were tubular adenomas.  3 yr follow-up was recommended.   PMH, PSH, SH reviewed   ROS:    PHYSICAL EXAM:  There were no vitals taken for this visit.      ASSESSMENT/PLAN:

## 2022-12-18 ENCOUNTER — Ambulatory Visit (INDEPENDENT_AMBULATORY_CARE_PROVIDER_SITE_OTHER): Payer: Medicare HMO | Admitting: Family Medicine

## 2022-12-18 ENCOUNTER — Encounter: Payer: Self-pay | Admitting: Family Medicine

## 2022-12-18 VITALS — BP 136/70 | HR 88 | Temp 98.3°F | Ht 63.0 in | Wt 125.6 lb

## 2022-12-18 DIAGNOSIS — Z8546 Personal history of malignant neoplasm of prostate: Secondary | ICD-10-CM | POA: Diagnosis not present

## 2022-12-18 DIAGNOSIS — R109 Unspecified abdominal pain: Secondary | ICD-10-CM | POA: Diagnosis not present

## 2022-12-18 LAB — POCT URINALYSIS DIP (PROADVANTAGE DEVICE)
Bilirubin, UA: NEGATIVE
Blood, UA: NEGATIVE
Glucose, UA: NEGATIVE mg/dL
Ketones, POC UA: NEGATIVE mg/dL
Leukocytes, UA: NEGATIVE
Nitrite, UA: NEGATIVE
Protein Ur, POC: NEGATIVE mg/dL
Specific Gravity, Urine: 1.005
Urobilinogen, Ur: 0.2
pH, UA: 7.5 (ref 5.0–8.0)

## 2022-12-18 NOTE — Patient Instructions (Signed)
  We are ordering a CT scan to further evaluate your pain that has been getting worse over the last month.  In the interim, if/when you get recurrent sharp pain, you can try taking tylenol, using a heating pad, and considering a medication like Gas-X if it persists. Stay well hydrated If you develop fever, worsening pain and/or vomiting, go to the ER. There is the chance this is musculoskeletal, in which case the heat and anti-inflammatories (ie motrin) may help We are evaluating for underlying more serious causes (bowels/prostate/bladder/scar tissue, etc).

## 2022-12-19 LAB — CBC WITH DIFFERENTIAL/PLATELET
Basophils Absolute: 0.1 10*3/uL (ref 0.0–0.2)
Basos: 1 %
EOS (ABSOLUTE): 0.1 10*3/uL (ref 0.0–0.4)
Eos: 3 %
Hematocrit: 47 % (ref 37.5–51.0)
Hemoglobin: 15.6 g/dL (ref 13.0–17.7)
Immature Grans (Abs): 0 10*3/uL (ref 0.0–0.1)
Immature Granulocytes: 0 %
Lymphocytes Absolute: 2 10*3/uL (ref 0.7–3.1)
Lymphs: 38 %
MCH: 31.6 pg (ref 26.6–33.0)
MCHC: 33.2 g/dL (ref 31.5–35.7)
MCV: 95 fL (ref 79–97)
Monocytes Absolute: 0.7 10*3/uL (ref 0.1–0.9)
Monocytes: 13 %
Neutrophils Absolute: 2.4 10*3/uL (ref 1.4–7.0)
Neutrophils: 45 %
Platelets: 214 10*3/uL (ref 150–450)
RBC: 4.94 x10E6/uL (ref 4.14–5.80)
RDW: 12.5 % (ref 11.6–15.4)
WBC: 5.2 10*3/uL (ref 3.4–10.8)

## 2022-12-19 LAB — COMPREHENSIVE METABOLIC PANEL
ALT: 25 IU/L (ref 0–44)
AST: 25 IU/L (ref 0–40)
Albumin: 4.6 g/dL (ref 3.8–4.8)
Alkaline Phosphatase: 72 IU/L (ref 44–121)
BUN/Creatinine Ratio: 16 (ref 10–24)
BUN: 16 mg/dL (ref 8–27)
Bilirubin Total: 0.5 mg/dL (ref 0.0–1.2)
CO2: 20 mmol/L (ref 20–29)
Calcium: 9.5 mg/dL (ref 8.6–10.2)
Chloride: 105 mmol/L (ref 96–106)
Creatinine, Ser: 1 mg/dL (ref 0.76–1.27)
Globulin, Total: 2.3 g/dL (ref 1.5–4.5)
Glucose: 112 mg/dL — ABNORMAL HIGH (ref 70–99)
Potassium: 4.5 mmol/L (ref 3.5–5.2)
Sodium: 141 mmol/L (ref 134–144)
Total Protein: 6.9 g/dL (ref 6.0–8.5)
eGFR: 79 mL/min/{1.73_m2} (ref >59)

## 2022-12-28 ENCOUNTER — Ambulatory Visit (HOSPITAL_BASED_OUTPATIENT_CLINIC_OR_DEPARTMENT_OTHER)
Admission: RE | Admit: 2022-12-28 | Discharge: 2022-12-28 | Disposition: A | Discharge status: Home / Self Care | Payer: Medicare HMO | Source: Ambulatory Visit | Attending: Family Medicine | Admitting: Family Medicine

## 2022-12-28 DIAGNOSIS — Z8546 Personal history of malignant neoplasm of prostate: Secondary | ICD-10-CM

## 2022-12-28 DIAGNOSIS — R109 Unspecified abdominal pain: Secondary | ICD-10-CM

## 2022-12-28 DIAGNOSIS — K5730 Diverticulosis of large intestine without perforation or abscess without bleeding: Secondary | ICD-10-CM | POA: Diagnosis not present

## 2022-12-28 DIAGNOSIS — R1031 Right lower quadrant pain: Secondary | ICD-10-CM | POA: Diagnosis not present

## 2022-12-28 DIAGNOSIS — C61 Malignant neoplasm of prostate: Secondary | ICD-10-CM | POA: Diagnosis not present

## 2022-12-28 MED ORDER — IOHEXOL 300 MG/ML  SOLN
100.0000 mL | Freq: Once | INTRAMUSCULAR | Status: AC | PRN
  Administered 2022-12-28: 65 mL via INTRAVENOUS

## 2022-12-30 ENCOUNTER — Other Ambulatory Visit: Payer: Self-pay | Admitting: Family Medicine

## 2022-12-30 DIAGNOSIS — J3089 Other allergic rhinitis: Secondary | ICD-10-CM

## 2023-01-25 IMAGING — CT CT HEART MORP W/ CTA COR W/ SCORE W/ CA W/CM &/OR W/O CM
1 series · 2 of 2 positions shown, 3 images · non-contrast
Comparison: None.
COMPARISON: None.

Addendum:
EXAM:
OVER-READ INTERPRETATION  CT CHEST

The following report is an over-read performed by radiologist Dr.
Yoshiki Akisue [REDACTED] on 08/02/2021. This over-read
does not include interpretation of cardiac or coronary anatomy or
pathology. The coronary CTA interpretation by the cardiologist is
attached.
CLINICAL DATA: 70M with chest pain
Cardiac/Coronary CTA
TECHNIQUE: The patient was scanned on a Phillips Force scanner.

[Series 3187: coronaries · 2 of 2 slices shown, 3 images]
[im 1/2  vessel]
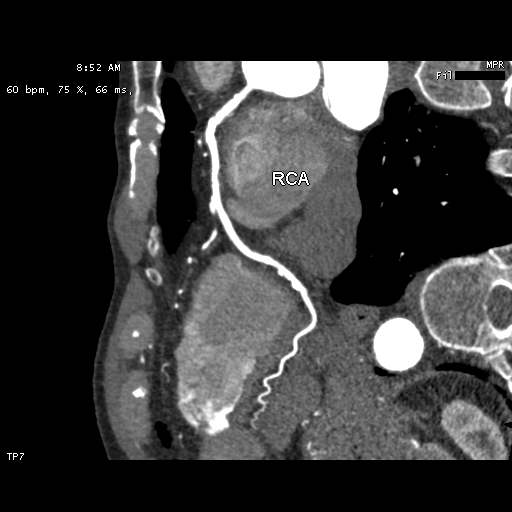
[im 1/2  lung]
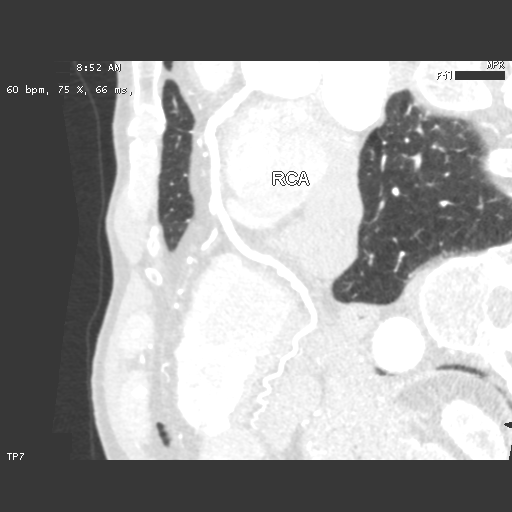
[im 2/2  vessel]
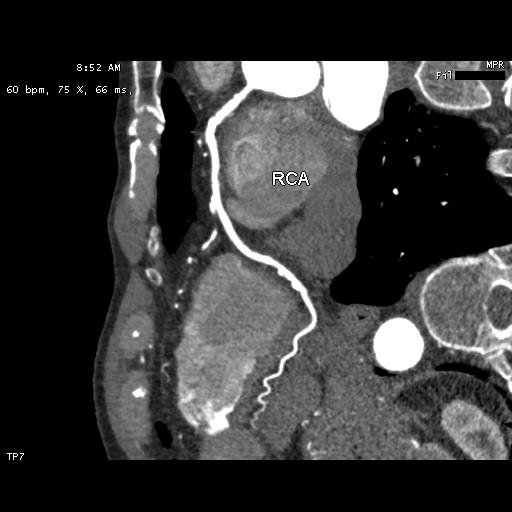

[2 of 2 positions shown; findings below may reference images not displayed]

FINDINGS: Vascular: No significant vascular findings. Normal heart size. No
pericardial effusion.

Mediastinum/Nodes: No lymphadenopathy within the visualized
mediastinum or hilar regions.

Lungs/Pleura: No suspicious pulmonary nodule or mass. No focal
airspace consolidation.

Upper Abdomen: Unremarkable.

Musculoskeletal: No worrisome lytic or sclerotic osseous
abnormality.
IMPRESSION: No acute or clinically significant extracardiac findings.
FINDINGS: A 100 kV prospective scan was triggered in the descending thoracic
aorta at 111 HU's. Axial non-contrast 3 mm slices were carried out
through the heart. The data set was analyzed on a dedicated work
station and scored using the Agatson method. Gantry rotation speed
was 250 msecs and collimation was .6 mm. 0.8 mg of sl NTG was given.
The 3D data set was reconstructed in 5% intervals of the 35-75% of
the R-R cycle. Phases were analyzed on a dedicated work station
using MPR, MIP and VRT modes. The patient received 80 cc of
contrast.

Coronary Arteries:  Normal coronary origin.  Right dominance.

RCA is a large dominant artery that gives rise to PDA and PLA.
Calcified plaque in the proximal RCA causes 25-49% stenosis.
Calcified plaque in the mid RCA causes 50-69% stenosis. Calcified
plaque in the distal RCA causes 25-49% stenosis

Left main is a large artery that gives rise to LAD and LCX arteries.

LAD is a large vessel. Calcified plaque in the proximal LAD causes
25-49% stenosis. Calcified plaque in the mid LAD causes 25-49%
stenosis. Calcified plaque in the distal LAD causes 25-49% stenosis

LCX is a non-dominant artery that gives rise to one large OM1
branch. Calcified plaque in the proximal LCX causes 0-24% stenosis.
Calcified plaque in the mid LCX causes 25-49% stenosis.

Other findings:

Left Ventricle: Normal size

Left Atrium: Normal size

Pulmonary Veins: Normal configuration

Right Ventricle: Normal size

Right Atrium: Normal size

Cardiac valves: No calcifications

Thoracic aorta: Normal size

Pulmonary Arteries: Normal size

Systemic Veins: Normal drainage

Pericardium: Normal thickness
IMPRESSION: 1. Coronary calcium score of 8171. This was 87th percentile for age
and sex matched control.

2. Normal coronary origin with right dominance.

3. Diffuse coronary calcifications, with most significant lesion
being mixed plaque in proximal to mid RCA causing moderate (50-69%)
stenosis. Will send for CTFFR

CAD-RADS 3. Moderate stenosis. Consider symptom-guided anti-ischemic
pharmacotherapy as well as risk factor modification per guideline
directed care. Additional analysis with CT FFR will be submitted.

*** End of Addendum ***
EXAM:
OVER-READ INTERPRETATION  CT CHEST

The following report is an over-read performed by radiologist Dr.
Yoshiki Akisue [REDACTED] on 08/02/2021. This over-read
does not include interpretation of cardiac or coronary anatomy or
pathology. The coronary CTA interpretation by the cardiologist is
attached.
FINDINGS: Vascular: No significant vascular findings. Normal heart size. No
pericardial effusion.

Mediastinum/Nodes: No lymphadenopathy within the visualized
mediastinum or hilar regions.

Lungs/Pleura: No suspicious pulmonary nodule or mass. No focal
airspace consolidation.

Upper Abdomen: Unremarkable.

Musculoskeletal: No worrisome lytic or sclerotic osseous
abnormality.
IMPRESSION: No acute or clinically significant extracardiac findings.

## 2023-01-30 ENCOUNTER — Encounter: Payer: Self-pay | Admitting: *Deleted

## 2023-05-02 DIAGNOSIS — H35371 Puckering of macula, right eye: Secondary | ICD-10-CM | POA: Diagnosis not present

## 2023-05-02 DIAGNOSIS — H52203 Unspecified astigmatism, bilateral: Secondary | ICD-10-CM | POA: Diagnosis not present

## 2023-05-02 DIAGNOSIS — H2513 Age-related nuclear cataract, bilateral: Secondary | ICD-10-CM | POA: Diagnosis not present

## 2023-05-02 DIAGNOSIS — H43813 Vitreous degeneration, bilateral: Secondary | ICD-10-CM | POA: Diagnosis not present

## 2023-05-22 ENCOUNTER — Ambulatory Visit: Payer: Medicare HMO | Admitting: Family Medicine

## 2023-05-22 ENCOUNTER — Ambulatory Visit
Admission: RE | Admit: 2023-05-22 | Discharge: 2023-05-22 | Disposition: A | Discharge status: Home / Self Care | Payer: Medicare HMO | Source: Ambulatory Visit | Attending: Family Medicine | Admitting: Family Medicine

## 2023-05-22 ENCOUNTER — Encounter: Payer: Self-pay | Admitting: Family Medicine

## 2023-05-22 VITALS — BP 132/72 | HR 96 | Ht 63.5 in | Wt 130.0 lb

## 2023-05-22 DIAGNOSIS — M79674 Pain in right toe(s): Secondary | ICD-10-CM | POA: Diagnosis not present

## 2023-05-22 DIAGNOSIS — M79671 Pain in right foot: Secondary | ICD-10-CM | POA: Diagnosis not present

## 2023-05-22 DIAGNOSIS — M7989 Other specified soft tissue disorders: Secondary | ICD-10-CM | POA: Diagnosis not present

## 2023-05-22 NOTE — Patient Instructions (Signed)
Go to [facility] for xrays of your foot. Continue tylenol as needed for pain (regular, or tylenol arthritis, which lasts longer) You can ice and elevate the foot as needed for pain. If you have significant increase in swelling or redness, please let me know. We briefly mentioned gout in the possible diagnoses, but it isn't acting like gout. If it gets worse, we may need to use an anti-inflammatory (the acetaminophen isn't anti-inflammatory). I suspect you may have an arthritic component. Consider using a hard-soled shoe for the next few days. If your pain isn't resolving, we may have you see a podiatrist for further evaluation and treatment.

## 2023-05-22 NOTE — Progress Notes (Signed)
Chief Complaint  Patient presents with   Swollen Toe   1/20 (Monday) he noticed a dull ache at the side of his right foot, at the base of the little toe. Denies change in activity, shoes or any known injury. It has gradually gotten worse, moreso this morning.  It really hurt to put weight on it when he got up and out of bed this morning. He was aware of the pain at night, but it wasn't waking him up.  He has taken acetaminophen, which seems to have helped.  Unsure if it was the medication, or if it eases off with more activity; hurts more after sitting, being immobile, upon first getting up. He notes some swelling in the foot.  He has h/o fracture of the right 5th toe 40 years ago.  Allergies are well controlled.  Requesting refill of flonase. Checked chart--given 90d supply September, with 1 RF (should have enough to last until March)   PMH, [facility], [facility] reviewed  Outpatient Encounter Medications as of 05/22/2023  Medication Sig   aspirin EC 81 MG tablet Take 81 mg by mouth daily. Swallow whole.   fluticasone (FLONASE) 50 MCG/ACT nasal spray Use 2 spray(s) in each nostril once daily   lisinopril (ZESTRIL) 5 MG tablet Take 1 tablet by mouth once daily   Multiple Vitamins-Minerals (MULTIVITAMIN WITH MINERALS) tablet Take 1 tablet by mouth daily.   Omega-3 Fatty Acids (FISH OIL) 1200 MG CAPS Take 1 capsule by mouth daily.   rosuvastatin (CRESTOR) 20 MG tablet Take 1 tablet (20 mg total) by mouth daily.   vitamin C (ASCORBIC ACID) 500 MG tablet Take 500 mg by mouth daily.   No facility-administered encounter medications on file as of 05/22/2023.   Took acetaminophen today  Allergies  Allergen Reactions   Augmentin [Amoxicillin-Pot Clavulanate] Swelling   Ciprofloxacin Hcl Other (See Comments)    Stated reddened skin   Ibuprofen Itching   Penicillins     Pruritis, joint pains,throat swelling when he took Augmentin   Sulfa Antibiotics Rash    ROS: no f/c, n/v/d, abdominal pain  persists, no longer having shooting pains. No URI symptoms, chest pain, shortness of breath or wheezing. Arthirits at base of L thumb/wrist. No other joint pains, other than the R foot per HPI.    PHYSICAL EXAM:  BP 132/72   Pulse 96   Ht 5' 3.5" (1.613 m)   Wt 130 lb (59 kg)   SpO2 93%   BMI 22.67 kg/m   Pleasant, well-appearing male, in no distress Extremities: 2+ pulses, no edema The right 5th toe and MTP have some mild erythema, very mild STS at the lateral foot. Pain with movement at 5th MTP, worse with 5th toe dorsiflexion, though some discomfort with flexion as well. Nontender along flexor tendons.   ASSESSMENT/PLAN:  Right foot pain - pain at R 5th MTP. no known trauma/injury. Suspect component of OA. Check x-ray. Cont tylenol, ice/elevate. Consider NSAID if worse, or podiatrist - Plan: DG Foot Complete Right    Go to [facility] for xrays of your foot. Continue tylenol as needed for pain (regular, or tylenol arthritis, which lasts longer) You can ice and elevate the foot as needed for pain. If you have significant increase in swelling or redness, please let me know. We briefly mentioned gout in the possible diagnoses, but it isn't acting like gout. If it gets worse, we may need to use an anti-inflammatory (the acetaminophen isn't anti-inflammatory). I suspect you may have an  arthritic component. Consider using a hard-soled shoe for the next few days. If your pain isn't resolving, we may have you see a podiatrist for further evaluation and treatment.

## 2023-05-25 ENCOUNTER — Encounter: Payer: Self-pay | Admitting: Family Medicine

## 2023-06-19 ENCOUNTER — Other Ambulatory Visit: Payer: Self-pay | Admitting: Family Medicine

## 2023-06-19 DIAGNOSIS — I10 Essential (primary) hypertension: Secondary | ICD-10-CM

## 2023-07-20 NOTE — Patient Instructions (Incomplete)
  HEALTH MAINTENANCE RECOMMENDATIONS:  It is recommended that you get at least 30 minutes of aerobic exercise at least 5 days/week (for weight loss, you may need as much as 60-90 minutes). This can be any activity that gets your heart rate up. This can be divided in 10-15 minute intervals if needed, but try and build up your endurance at least once a week.  Weight bearing exercise is also recommended twice weekly.  Eat a healthy diet with lots of vegetables, fruits and fiber.  "Colorful" foods have a lot of vitamins (ie green vegetables, tomatoes, red peppers, etc).  Limit sweet tea, regular sodas and alcoholic beverages, all of which has a lot of calories and sugar.  Up to 2 alcoholic drinks daily may be beneficial for men (unless trying to lose weight, watch sugars).  Drink a lot of water.  Sunscreen of at least SPF 30 should be used on all sun-exposed parts of the skin when outside between the hours of 10 am and 4 pm (not just when at beach or pool, but even with exercise, golf, tennis, and yard work!)  Use a sunscreen that says "broad spectrum" so it covers both UVA and UVB rays, and make sure to reapply every 1-2 hours.  Remember to change the batteries in your smoke detectors when changing your clock times in the spring and fall.  Carbon monoxide detectors are recommended for your home.  Use your seat belt every time you are in a car, and please drive safely and not be distracted with cell phones and texting while driving.    Gary Blair , Thank you for taking time to come for your Medicare Wellness Visit. I appreciate your ongoing commitment to your health goals. Please review the following plan we discussed and let me know if I can assist you in the future.   This is a list of the screening recommended for you and due dates:  Health Maintenance  Topic Date Due   COVID-19 Vaccine (8 - Pfizer risk 2024-25 season) 07/31/2023   Screening for Lung Cancer  08/22/2023   Medicare Annual Wellness  Visit  07/22/2024   DTaP/Tdap/Td vaccine (4 - Td or Tdap) 05/19/2025   Colon Cancer Screening  07/10/2025   Pneumonia Vaccine  Completed   Flu Shot  Completed   Hepatitis C Screening  Completed   Zoster (Shingles) Vaccine  Completed   HPV Vaccine  Aged Out    We discussed regular stretching and working on balance.  Consider adding claritin, zyrtec or allegra instead of Benadryl if your allergies start to flare (despite using the flonase daily).  These work all day, and don't make you sleepy (Benadryl can make you sleepy, only works for 6 hours).

## 2023-07-20 NOTE — Progress Notes (Unsigned)
 No chief complaint on file.  Gary Blair is a 73 y.o. male who presents for annual physical exam, Medicare wellness visit and follow-up on chronic medical conditions.    Hypertension.  He is compliant with taking lisinopril daily. Some cough related to PND (which was present prior to starting the ACEI), and sometimes related to what he eats (ie popcorn, peanuts).  He tries to limit the sodium in his diet. BP's have been running  Denies dizziness, headaches,  palpitations, edema, shortness of breath, exertional chest pain.  BP Readings from Last 3 Encounters:  05/22/23 132/72  12/18/22 136/70  08/07/22 128/70   CAD:  Elevated coronary calcium score in 06/2021 (score of 1184, 85th percentile) .  He was referred to cardiology, underwent coronary CTA. This showed 50-69% mid RCA stenosis.  CT FFR was performed, suggesting nonobstructive CAD.  He was started on 10mg  of rosuvastatin and aspirin (by cardiologist). Goal LDL <50 per Dr. Marylene Buerger notes. LDL after starting medication was 54. LDL was up to 83 at CPE in 06/2022.  Rosuvastatin was increased to 20 mg at that time, and f/u LDL was 59 in 09/2022. Due for recheck.  He reports compliance with 20 mg of rosuvastatin, denies side effects. He tries to follow low cholesterol diet.  He denies any changes to his diet in the last year--has red meat 1-2x/week, 2 eggs/week.   +pimiento cheese and low sugar ice cream daily.  Rare creamy soups/sauces/dressings, not much butter, no fried foods.  Lab Results  Component Value Date   CHOL 147 10/02/2022   HDL 76 10/02/2022   LDLCALC 59 10/02/2022   TRIG 60 10/02/2022   CHOLHDL 1.9 10/02/2022    Chronic/intermittent cough/congestion--still has some clear mucus/phlegm in the mornings, and allergies as reported above. Sometimes he coughs related to eating peanuts too fast  Impaired fasting glucose: he doesn't check sugars.  Last A1c was 5.9% in 06/2022.  He tries to limit sugar in his diet (candy,  sweets, no regular sodas or sweet tea--very rare).   He eats sugar-free ice cream (most of the time).  Alcohol--average of 1 drink per day (usually beer, occasionally a shot of bourbon), maybe 2-3 on a weekend, total of up to 10-12/week.   Prostate Cancer:  He is s/p brachytherapy 09/2014.  He last saw urologist in 12/2021, doing well, PSA remained low. He denies any significant urinary complaints (slight hesitancy and weakened stream, unchanged). Up 0-1 times/night to void. He was released from the care of urology in 12/2021, advised PCP can check yearly PSA. He has ED (not treating) and continues to note very little (or no) ejaculate.  Immunization History  Administered Date(s) Administered   Fluad Quad(high Dose 65+) 01/30/2023   Influenza Split 03/16/2005, 02/15/2006, 02/01/2007, 02/25/2008, 04/20/2009, 01/28/2012   Influenza Whole 02/09/2013   Influenza, High Dose Seasonal PF 01/23/2016, 01/27/2017, 01/04/2018, 12/20/2018, 12/29/2019, 01/01/2022   Influenza,trivalent, recombinat, inj, PF 01/08/2014   Influenza-Unspecified 01/01/2015, 01/04/2018, 12/28/2020   PFIZER(Purple Top)SARS-COV-2 Vaccination 06/05/2019, 06/30/2019, 02/01/2020, 09/04/2020   PNEUMOCOCCAL CONJUGATE-20 07/17/2022   Pfizer Covid-19 Vaccine Bivalent Booster 9yrs & up 01/19/2021   Pfizer(Comirnaty)Fall Seasonal Vaccine 12 years and older 01/30/2023   Pneumococcal Conjugate-13 04/11/2014   Pneumococcal Polysaccharide-23 12/14/2004, 04/17/2015   Respiratory Syncytial Virus Vaccine,Recomb Aduvanted(Arexvy) 01/01/2022   Td 06/27/1989, 09/30/2001   Tdap 05/20/2015   Unspecified SARS-COV-2 Vaccination 02/06/2022   Zoster Recombinant(Shingrix) 05/30/2020, 07/29/2020   Zoster, Live 04/26/2011   Last colonoscopy: 07/11/22 (Dr. Orvan Falconer). Diverticulosis and tubular adenomas. 3 yr  f/u recommended Last PSA: per urologist, UTD (12/2021, result not received) Dentist: twice yearly   Ophtho: yearly Exercise:   Going to [employer] 4-5x/week (cardio x 30-45 mins, plus weight machines)  Patient Care Team: Joselyn Arrow, MD as PCP - General (Family Medicine) Dr. Wallene Huh as Consulting Physician (Dentistry) Janet Berlin, MD as Consulting Physician (Ophthalmology) Marcine Matar, MD as Consulting Physician (Urology) Ralene Cork, DO as Consulting Physician (Sports Medicine) Bettey Costa, MD as Consulting Physician (Dermatology) Tressia Danas, MD (Inactive) as Consulting Physician (Gastroenterology) GI:  Dr. Orvan Falconer (Radiation onc: Dr. Dayton Scrape)  Depression Screening: Flowsheet Row Office Visit from 07/17/2022 in [facility]  PHQ-2 Total Score 0        Falls screen:     05/22/2023    1:32 PM 07/17/2022    8:34 AM 08/16/2021    2:25 PM 07/05/2021    1:42 PM 12/21/2020   10:41 AM  Fall Risk   Falls in the past year? 0 0 0 0 0  Number falls in past yr:  0 0 0 0  Injury with Fall? 0 0 0 0 0  Risk for fall due to : No Fall Risks No Fall Risks No Fall Risks No Fall Risks No Fall Risks  Follow up Falls evaluation completed Falls evaluation completed Falls evaluation completed Falls evaluation completed Falls evaluation completed     Functional Status Survey:         End of Life Discussion:  Patient has a living will and medical power of attorney (and brought in 03/2015)    PMH, PSH, SH and FH were reviewed and updated     ROS: The patient denies fever, chills, Denies headaches, vision loss, ear pain, palpitations, dizziness, syncope, dyspnea on exertion, swelling, constipation, melena, hematochezia, hematuria, genital lesions, weakness, tremor, suspicious skin lesions (sees derm regularly); denies depression, anxiety, abnormal bleeding/bruising, or enlarged lymph nodes.   He has carpal tunnel on the left, wears brace to sleep at night, which helps. He has some back stiffness in the morning, only rare/minor knee pain.  Has some pain in his wrists, which he says is  arthritis (right). All unchanged from last year. Chronic PND, per HPI Some hesitancy and weakened stream, unchanged.  No nocturia or incontinence. +ED.  Denies any chest pain, DOE.   PHYSICAL EXAM:  There were no vitals taken for this visit.  Wt Readings from Last 3 Encounters:  05/22/23 130 lb (59 kg)  12/18/22 125 lb 9.6 oz (57 kg)  08/07/22 126 lb 3.2 oz (57.2 kg)     General Appearance:    Alert, cooperative, no distress, appears stated age. Some throat-clearing during visit, no cough  Head:    Normocephalic, without obvious abnormality, atraumatic     Eyes:    PERRL, conjunctiva/corneas clear, EOM's intact, fundi benign     Ears:    Normal TMs and EACs  Nose:    No drainage or sinus tenderness  Throat:    Normal mucosa  Neck:    Supple, no lymphadenopathy; thyroid: no enlargement/tenderness/nodules; no carotid bruit or JVD.    Back:    Spine nontender, no curvature, ROM normal, no CVA tenderness     Lungs:    Clear to auscultation bilaterally without wheezes, rales or ronchi; respirations unlabored     Chest Wall:    No tenderness or deformity     Heart:    Regular rate and rhythm, S1 and S2 normal, no murmur, rub or  gallop     Breast Exam:    No chest wall tenderness, masses or gynecomastia     Abdomen:    Soft, non-tender, nondistended, normoactive bowel sounds, no masses, no hepatosplenomegaly.   Genitalia:    ***  Rectal:    ***  Extremities:    No clubbing, cyanosis or edema.  Pulses:    2+ and symmetric all extremities     Skin:    Skin color, texture, turgor normal, no rashes. Purpura at R shin, L forearm  Lymph nodes:    Cervical, supraclavicular, and inguinal nodes normal     Neurologic:    Normal strength, sensation and gait; reflexes 2+ and symmetric throughout                        Psych:    Normal mood, affect, hygiene and grooming   UPDATE ****--throat clearing? Add in GU and REctal, since no longer sees urologist   Lab Results  Component Value Date    HGBA1C 5.9 (H) 07/15/2022   Fasting glucose 97    Chemistry      Component Value Date/Time   NA 141 12/18/2022 1115   K 4.5 12/18/2022 1115   CL 105 12/18/2022 1115   CO2 20 12/18/2022 1115   BUN 16 12/18/2022 1115   CREATININE 1.00 12/18/2022 1115   CREATININE 0.96 04/17/2016 0844      Component Value Date/Time   CALCIUM 9.5 12/18/2022 1115   ALKPHOS 72 12/18/2022 1115   AST 25 12/18/2022 1115   ALT 25 12/18/2022 1115   BILITOT 0.5 12/18/2022 1115     Lab Results  Component Value Date   WBC 5.2 12/18/2022   HGB 15.6 12/18/2022   HCT 47.0 12/18/2022   MCV 95 12/18/2022   PLT 214 12/18/2022   Lab Results  Component Value Date   CHOL 147 10/02/2022   HDL 76 10/02/2022   LDLCALC 59 10/02/2022   TRIG 60 10/02/2022   CHOLHDL 1.9 10/02/2022      ASSESSMENT/PLAN:  A1c Lipids, psa, c-met, cbc  ***AWAIT LIPID RESULTS TO REFILL CRESTOR  He no longer sees urologist  Recommended at least 30 minutes of aerobic activity at least 5 days/week, weight-bearing exercise at least 2x/week; proper sunscreen use reviewed; healthy diet and alcohol recommendations (less than or equal to 2 drinks/day) reviewed; regular seatbelt use; changing batteries in smoke detectors. Immunization recommendations discussed--continue yearly flu shots.  COVID booster ***  Colonoscopy recommendations reviewed, UTD, due again 06/2025. Annual lung cancer screening is UTD, scheduled for 07/2023.   MOST form reviewed and signed. Full Code, Full Care.  F/u 1 year for CPE/AWV   Medicare Attestation I have personally reviewed: The patient's medical and social history Their use of alcohol, tobacco or illicit drugs Their current medications and supplements The patient's functional ability including ADLs,fall risks, home safety risks, cognitive, and hearing and visual impairment Diet and physical activities Evidence for depression or mood disorders  The patient's weight, height, BMI have been  recorded in the chart.  I have made referrals, counseling, and provided education to the patient based on review of the above and I have provided the patient with a written personalized care plan for preventive services.

## 2023-07-23 ENCOUNTER — Ambulatory Visit: Payer: Medicare HMO | Admitting: Family Medicine

## 2023-07-23 ENCOUNTER — Encounter: Payer: Self-pay | Admitting: Family Medicine

## 2023-07-23 VITALS — BP 120/70 | HR 80 | Ht 63.5 in | Wt 126.2 lb

## 2023-07-23 DIAGNOSIS — R233 Spontaneous ecchymoses: Secondary | ICD-10-CM | POA: Diagnosis not present

## 2023-07-23 DIAGNOSIS — Z125 Encounter for screening for malignant neoplasm of prostate: Secondary | ICD-10-CM

## 2023-07-23 DIAGNOSIS — Z5181 Encounter for therapeutic drug level monitoring: Secondary | ICD-10-CM | POA: Diagnosis not present

## 2023-07-23 DIAGNOSIS — E7800 Pure hypercholesterolemia, unspecified: Secondary | ICD-10-CM | POA: Diagnosis not present

## 2023-07-23 DIAGNOSIS — Z0000 Encounter for general adult medical examination without abnormal findings: Secondary | ICD-10-CM

## 2023-07-23 DIAGNOSIS — J3089 Other allergic rhinitis: Secondary | ICD-10-CM

## 2023-07-23 DIAGNOSIS — R7301 Impaired fasting glucose: Secondary | ICD-10-CM

## 2023-07-23 DIAGNOSIS — I2510 Atherosclerotic heart disease of native coronary artery without angina pectoris: Secondary | ICD-10-CM | POA: Diagnosis not present

## 2023-07-23 DIAGNOSIS — I10 Essential (primary) hypertension: Secondary | ICD-10-CM

## 2023-07-23 DIAGNOSIS — Z8546 Personal history of malignant neoplasm of prostate: Secondary | ICD-10-CM

## 2023-07-23 DIAGNOSIS — I700 Atherosclerosis of aorta: Secondary | ICD-10-CM

## 2023-07-23 LAB — POCT GLYCOSYLATED HEMOGLOBIN (HGB A1C): Hemoglobin A1C: 5.7 % — AB (ref 4.0–5.6)

## 2023-07-23 LAB — LIPID PANEL

## 2023-07-24 ENCOUNTER — Encounter: Payer: Self-pay | Admitting: Family Medicine

## 2023-07-24 LAB — CMP14+EGFR
ALT: 34 IU/L (ref 0–44)
AST: 34 IU/L (ref 0–40)
Albumin: 4.6 g/dL (ref 3.8–4.8)
Alkaline Phosphatase: 81 IU/L (ref 44–121)
BUN/Creatinine Ratio: 11 (ref 10–24)
BUN: 12 mg/dL (ref 8–27)
Bilirubin Total: 0.7 mg/dL (ref 0.0–1.2)
CO2: 20 mmol/L (ref 20–29)
Calcium: 9.4 mg/dL (ref 8.6–10.2)
Chloride: 101 mmol/L (ref 96–106)
Creatinine, Ser: 1.12 mg/dL (ref 0.76–1.27)
Globulin, Total: 2.3 g/dL (ref 1.5–4.5)
Glucose: 118 mg/dL — ABNORMAL HIGH (ref 70–99)
Potassium: 3.9 mmol/L (ref 3.5–5.2)
Sodium: 140 mmol/L (ref 134–144)
Total Protein: 6.9 g/dL (ref 6.0–8.5)
eGFR: 69 mL/min/{1.73_m2} (ref >59)

## 2023-07-24 LAB — CBC WITH DIFFERENTIAL/PLATELET
Basophils Absolute: 0 10*3/uL (ref 0.0–0.2)
Basos: 1 %
EOS (ABSOLUTE): 0.1 10*3/uL (ref 0.0–0.4)
Eos: 2 %
Hematocrit: 46.9 % (ref 37.5–51.0)
Hemoglobin: 15.9 g/dL (ref 13.0–17.7)
Immature Grans (Abs): 0 10*3/uL (ref 0.0–0.1)
Immature Granulocytes: 0 %
Lymphocytes Absolute: 2 10*3/uL (ref 0.7–3.1)
Lymphs: 39 %
MCH: 31.7 pg (ref 26.6–33.0)
MCHC: 33.9 g/dL (ref 31.5–35.7)
MCV: 93 fL (ref 79–97)
Monocytes Absolute: 0.7 10*3/uL (ref 0.1–0.9)
Monocytes: 13 %
Neutrophils Absolute: 2.4 10*3/uL (ref 1.4–7.0)
Neutrophils: 45 %
Platelets: 198 10*3/uL (ref 150–450)
RBC: 5.02 x10E6/uL (ref 4.14–5.80)
RDW: 12.2 % (ref 11.6–15.4)
WBC: 5.2 10*3/uL (ref 3.4–10.8)

## 2023-07-24 LAB — LIPID PANEL
Cholesterol, Total: 150 mg/dL (ref 100–199)
HDL: 78 mg/dL (ref >39)
LDL CALC COMMENT:: 1.9 ratio (ref 0.0–5.0)
LDL Chol Calc (NIH): 59 mg/dL (ref 0–99)
Triglycerides: 62 mg/dL (ref 0–149)
VLDL Cholesterol Cal: 13 mg/dL (ref 5–40)

## 2023-07-24 LAB — PSA

## 2023-08-09 ENCOUNTER — Other Ambulatory Visit: Payer: Self-pay | Admitting: Family Medicine

## 2023-08-09 DIAGNOSIS — E7800 Pure hypercholesterolemia, unspecified: Secondary | ICD-10-CM

## 2023-08-09 DIAGNOSIS — I700 Atherosclerosis of aorta: Secondary | ICD-10-CM

## 2023-08-25 ENCOUNTER — Ambulatory Visit (HOSPITAL_BASED_OUTPATIENT_CLINIC_OR_DEPARTMENT_OTHER)
Admission: RE | Admit: 2023-08-25 | Discharge: 2023-08-25 | Disposition: A | Discharge status: Home / Self Care | Source: Ambulatory Visit | Attending: Family Medicine | Admitting: Family Medicine

## 2023-08-25 DIAGNOSIS — Z122 Encounter for screening for malignant neoplasm of respiratory organs: Secondary | ICD-10-CM | POA: Diagnosis not present

## 2023-08-25 DIAGNOSIS — Z87891 Personal history of nicotine dependence: Secondary | ICD-10-CM | POA: Insufficient documentation

## 2023-08-30 ENCOUNTER — Other Ambulatory Visit: Payer: Self-pay | Admitting: Family Medicine

## 2023-08-30 DIAGNOSIS — I10 Essential (primary) hypertension: Secondary | ICD-10-CM

## 2023-09-01 ENCOUNTER — Other Ambulatory Visit: Payer: Self-pay | Admitting: *Deleted

## 2023-09-05 ENCOUNTER — Encounter: Payer: Self-pay | Admitting: Family Medicine

## 2023-09-05 NOTE — Telephone Encounter (Unsigned)
 Copied from CRM [id number]. Topic: Clinical - Lab/Test Results >> Sep 05, 2023 11:55 AM Juliana Ocean wrote: Reason for CRM: pt concerned he has not heard anything about his lung cancer screening CT.  Advised pt they are running behaind and it may be another 2 weeks.  But keep watching his my chart. Pt verbalized understanding, was very kind and thanked me.

## 2023-09-17 ENCOUNTER — Telehealth: Payer: Self-pay | Admitting: *Deleted

## 2023-09-17 ENCOUNTER — Other Ambulatory Visit: Payer: Self-pay

## 2023-09-17 ENCOUNTER — Telehealth (HOSPITAL_BASED_OUTPATIENT_CLINIC_OR_DEPARTMENT_OTHER): Payer: Self-pay

## 2023-09-17 DIAGNOSIS — Z122 Encounter for screening for malignant neoplasm of respiratory organs: Secondary | ICD-10-CM

## 2023-09-17 DIAGNOSIS — Z87891 Personal history of nicotine dependence: Secondary | ICD-10-CM

## 2023-09-17 NOTE — Telephone Encounter (Signed)
 Copied from CRM [id number]. Topic: Clinical - Lab/Test Results >> Sep 17, 2023 10:55 AM Antwanette L wrote: Reason for CRM: Patient is calling about lung cancer screening results.  Patient is requesting a call back at [phone]  Can you review these so I can call patient with results please, thanks.

## 2023-09-17 NOTE — Telephone Encounter (Signed)
 Copied from CRM [id number]. Topic: Clinical - Lab/Test Results >> Sep 17, 2023 10:55 AM Antwanette L wrote: Reason for CRM: Patient is calling about lung cancer screening results.  Patient is requesting a call back at [phone]

## 2023-09-17 NOTE — Telephone Encounter (Signed)
 Patient advised of results and sent Dr. Elodia Hailstone copy.

## 2023-09-18 ENCOUNTER — Telehealth: Payer: Self-pay

## 2023-09-18 NOTE — Telephone Encounter (Signed)
 Returned call. LVM and request call back if further questions. Advised result letter sent to [facility] 09/16/2023.

## 2023-09-18 NOTE — Telephone Encounter (Signed)
 Good Afternoon,   Lung CT results have been reviewed with the patient. Patient advises he has already spoken with Dr. Chase Copping office and they have forwarded results to Dr. Rolm Clos for follow up on Extensive multi-vessel coronary artery calcification and Aortic Atherosclerosis . Results have been sent to PCP and Cardiology.   Dr. Monnie Anthony and Dr. Rolm Clos, please let us  know if we can further assist you with this patient.   IMPRESSION: Lung-RADS 2, benign appearance or behavior. Continue annual screening with low-dose chest CT without contrast in 12 months.   Extensive multi-vessel coronary artery calcification   Aortic Atherosclerosis (ICD10-I70.0).

## 2023-10-13 ENCOUNTER — Other Ambulatory Visit: Payer: Self-pay | Admitting: Family Medicine

## 2023-10-13 DIAGNOSIS — J3089 Other allergic rhinitis: Secondary | ICD-10-CM

## 2023-11-26 ENCOUNTER — Telehealth: Payer: Self-pay | Admitting: *Deleted

## 2023-11-26 NOTE — Progress Notes (Unsigned)
 No chief complaint on file.  Patient presents with complaint of bilateral ear wax, desiring wax removal    PMH, PSH, SH reviewed   ROS:    PHYSICAL EXAM:  There were no vitals taken for this visit.      ASSESSMENT/PLAN:

## 2023-11-26 NOTE — Telephone Encounter (Signed)
 Copied from CRM [id number]. Topic: General - Other >> Nov 26, 2023 11:07 AM Treva T wrote: Reason for CRM: Patient calling, requesting a sooner appointment for ear cleaning, due to wax build up. He is currently scheduled on 12/08/23.  Patient requesting a return call from nurse, if he can be worked in sooner for ear cleaning. States he has had a previous visit for a nurse visit only.  Patient can be reached at [phone] to discuss further.  Patient coming tomorrow at 9:30

## 2023-11-27 ENCOUNTER — Encounter: Payer: Self-pay | Admitting: Family Medicine

## 2023-11-27 ENCOUNTER — Ambulatory Visit (INDEPENDENT_AMBULATORY_CARE_PROVIDER_SITE_OTHER): Admitting: Family Medicine

## 2023-11-27 VITALS — BP 132/72 | HR 84 | Temp 98.2°F | Ht 63.5 in | Wt 126.0 lb

## 2023-11-27 DIAGNOSIS — H6121 Impacted cerumen, right ear: Secondary | ICD-10-CM | POA: Diagnosis not present

## 2023-11-27 DIAGNOSIS — R21 Rash and other nonspecific skin eruption: Secondary | ICD-10-CM

## 2023-11-27 NOTE — Patient Instructions (Signed)
 Use hydrocortisone 1% 2-3 times daily if needed for itching at the areas with rash. If this isn't strong enough (not working, still itching and having rash), let us  know and we can send something in stronger. The upper portion on the back of the right leg is scratched/open.  Use some antibacterial ointment twice daily to that area, and try and avoid scratching.  Return if spreading rash, increasing pain, redness, drainage or crusting, fever, or any other concerns.

## 2023-12-01 ENCOUNTER — Encounter: Payer: Self-pay | Admitting: Family Medicine

## 2023-12-01 ENCOUNTER — Ambulatory Visit (INDEPENDENT_AMBULATORY_CARE_PROVIDER_SITE_OTHER): Admitting: Family Medicine

## 2023-12-01 VITALS — BP 130/70 | HR 76 | Ht 63.5 in | Wt 126.4 lb

## 2023-12-01 DIAGNOSIS — H6121 Impacted cerumen, right ear: Secondary | ICD-10-CM | POA: Diagnosis not present

## 2023-12-01 NOTE — Progress Notes (Addendum)
 Chief Complaint  Patient presents with   Cerumen Impaction    Right ear is still bothering him.    Patient presents with ongoing decreased hearing in the right ear.  He was able to hear better when he left the office the other day, but now can't hear. He denies pain  He is donating his body to science through [employer].  PMH, PSH, SH reviewed  Outpatient Encounter Medications as of 12/01/2023  Medication Sig   aspirin EC 81 MG tablet Take 81 mg by mouth daily. Swallow whole.   fluticasone  (FLONASE ) 50 MCG/ACT nasal spray Use 2 spray(s) in each nostril once daily   lisinopril  (ZESTRIL ) 5 MG tablet Take 1 tablet by mouth once daily   Multiple Vitamins-Minerals (MULTIVITAMIN WITH MINERALS) tablet Take 1 tablet by mouth daily.   Omega-3 Fatty Acids (FISH OIL) 1200 MG CAPS Take 1 capsule by mouth daily.   rosuvastatin  (CRESTOR ) 20 MG tablet Take 1 tablet by mouth once daily   vitamin C (ASCORBIC ACID) 500 MG tablet Take 500 mg by mouth daily.   No facility-administered encounter medications on file as of 12/01/2023.   Allergies  Allergen Reactions   Augmentin [Amoxicillin-Pot Clavulanate] Swelling   Ciprofloxacin  Hcl Other (See Comments)    Stated reddened skin   Ibuprofen Itching   Penicillins     Pruritis, joint pains,throat swelling when he took Augmentin   Sulfa Antibiotics Rash    ROS: no fever, chills, ear pain. No URI symptoms, cough, CP    PHYSICAL EXAM:  BP 130/70   Pulse 76   Ht 5' 3.5 (1.613 m)   Wt 126 lb 6.4 oz (57.3 kg)   BMI 22.04 kg/m   Pleasant, well-appearing male in no distress R ear:  initially (after soaking and lavage by nurse) showed soft, white/light yellow cerumen centrally, blocking TM. Curette was used--was able to move the wax more peripherally (not much was removed), and this cleared the central portion of the canal. TM was normal. There was some residual irritation of inferior canal (from last week), and now also pink/slightly inflamed slightly  posteriorly (7 o'clock position), no bleeding. Further lavage performed.  There is some persistent white material (cerumen/debris) posteriorly and very slight anteriorly. Neck: no lymphadenopathy or mass    ASSESSMENT/PLAN:  Hearing loss of right ear due to cerumen impaction - improved with combination of lavage and curette--cleared centrally and hearing improved. Some remaining cerumen  Impacted cerumen of right ear - treated with combination of lavage and curette--didn't remove all cerumen, but cleared it centrally and hearing improved  Advised if he has recurrent issue with hearing in the near future, will refer to ENT for removal (better instruments, suction).

## 2023-12-03 ENCOUNTER — Encounter: Payer: Self-pay | Admitting: Family Medicine

## 2023-12-03 ENCOUNTER — Other Ambulatory Visit: Payer: Self-pay | Admitting: *Deleted

## 2023-12-03 DIAGNOSIS — H6121 Impacted cerumen, right ear: Secondary | ICD-10-CM

## 2023-12-08 ENCOUNTER — Ambulatory Visit: Admitting: Family Medicine

## 2024-01-08 ENCOUNTER — Encounter: Payer: Self-pay | Admitting: Family Medicine

## 2024-01-08 ENCOUNTER — Encounter: Payer: Self-pay | Admitting: *Deleted

## 2024-01-08 ENCOUNTER — Encounter: Payer: Self-pay | Admitting: Cardiovascular Disease

## 2024-01-08 ENCOUNTER — Other Ambulatory Visit: Payer: Self-pay | Admitting: *Deleted

## 2024-01-08 MED ORDER — MNEXSPIKE 10 MCG/0.2ML IM SUSY: 0.2000 mL | PREFILLED_SYRINGE | Freq: Once | INTRAMUSCULAR | 0 refills | Status: AC

## 2024-01-08 MED ORDER — COVID-19 MRNA VAC-TRIS(PFIZER) 30 MCG/0.3ML IM SUSY: 0.3000 mL | PREFILLED_SYRINGE | Freq: Once | INTRAMUSCULAR | 0 refills | Status: AC

## 2024-01-09 DIAGNOSIS — L821 Other seborrheic keratosis: Secondary | ICD-10-CM | POA: Diagnosis not present

## 2024-01-09 DIAGNOSIS — Z85828 Personal history of other malignant neoplasm of skin: Secondary | ICD-10-CM | POA: Diagnosis not present

## 2024-01-09 DIAGNOSIS — D1801 Hemangioma of skin and subcutaneous tissue: Secondary | ICD-10-CM | POA: Diagnosis not present

## 2024-01-09 DIAGNOSIS — L570 Actinic keratosis: Secondary | ICD-10-CM | POA: Diagnosis not present

## 2024-01-09 DIAGNOSIS — D692 Other nonthrombocytopenic purpura: Secondary | ICD-10-CM | POA: Diagnosis not present

## 2024-01-09 DIAGNOSIS — D225 Melanocytic nevi of trunk: Secondary | ICD-10-CM | POA: Diagnosis not present

## 2024-01-09 DIAGNOSIS — L819 Disorder of pigmentation, unspecified: Secondary | ICD-10-CM | POA: Diagnosis not present

## 2024-01-19 ENCOUNTER — Encounter: Payer: Self-pay | Admitting: Family Medicine

## 2024-03-07 NOTE — Progress Notes (Unsigned)
  Cardiology Office Note:  .   Date:  03/07/2024  ID:  Gary Blair, DOB Oct 27, 1950, MRN [id number] PCP: Randol Dawes, MD  [facility] Providers Cardiologist:  None { Click to update primary MD,subspecialty MD or APP then REFRESH:1}   History of Present Illness: .   No chief complaint on file.   Gary Blair is a 73 y.o. male with history of CAD who presents for follow-up.      Problem List CAD -CAC score 1343 (87th percentile) -mid RCA 50-69% (CT FFR 0.80) 2. HLD -T chol 150, HDL 78, LDL 59, TG 62 3. HTN    ROS: All other ROS reviewed and negative. Pertinent positives noted in the HPI.     Studies Reviewed: SABRA       CCTA 08/02/2021 IMPRESSION: 1. Coronary calcium  score of 1343. This was 87th percentile for age and sex matched control.   2. Normal coronary origin with right dominance.   3. Diffuse coronary calcifications, with most significant lesion being mixed plaque in proximal to mid RCA causing moderate (50-69%) stenosis. Will send for CTFFR Physical Exam:   VS:  There were no vitals taken for this visit.   Wt Readings from Last 3 Encounters:  12/01/23 126 lb 6.4 oz (57.3 kg)  11/27/23 126 lb (57.2 kg)  07/23/23 126 lb 3.2 oz (57.2 kg)    GEN: Well nourished, well developed in no acute distress NECK: No JVD; No carotid bruits CARDIAC: ***RRR, no murmurs, rubs, gallops RESPIRATORY:  Clear to auscultation without rales, wheezing or rhonchi  ABDOMEN: Soft, non-tender, non-distended EXTREMITIES:  No edema; No deformity  ASSESSMENT AND PLAN: .   ***    {Are you ordering a CV Procedure (e.g. stress test, cath, DCCV, TEE, etc)?   Press F2        :[id number]}   Follow-up: No follow-ups on file.  Signed, Darryle DASEN. Barbaraann, MD, [facility]  [facility]  [address] [place], [place] [address] [phone]  8:06 PM

## 2024-03-08 ENCOUNTER — Ambulatory Visit: Attending: Cardiovascular Disease | Admitting: Cardiovascular Disease

## 2024-03-08 ENCOUNTER — Encounter: Payer: Self-pay | Admitting: Cardiovascular Disease

## 2024-03-08 VITALS — BP 168/80 | HR 96 | Ht 64.0 in | Wt 130.8 lb

## 2024-03-08 DIAGNOSIS — E782 Mixed hyperlipidemia: Secondary | ICD-10-CM | POA: Diagnosis not present

## 2024-03-08 DIAGNOSIS — I2510 Atherosclerotic heart disease of native coronary artery without angina pectoris: Secondary | ICD-10-CM

## 2024-03-08 NOTE — Patient Instructions (Signed)
Medication Instructions:   Your physician recommends that you continue on your current medications as directed. Please refer to the Current Medication list given to you today.  *If you need a refill on your cardiac medications before your next appointment, please call your pharmacy*   Follow-Up:  As needed

## 2024-03-11 NOTE — Progress Notes (Signed)
 SEATON HOFMANN                                          MRN: [id number]   03/11/2024   The [facility] Quality Team Specialist reviewed this patient medical record for the purposes of chart review for care gap closure. The following were reviewed: chart review for care gap closure-controlling blood pressure.    [facility]BCI Quality Team

## 2024-04-16 ENCOUNTER — Encounter: Payer: Self-pay | Admitting: Family Medicine

## 2024-05-18 ENCOUNTER — Other Ambulatory Visit: Payer: Self-pay | Admitting: Family Medicine

## 2024-05-18 DIAGNOSIS — I700 Atherosclerosis of aorta: Secondary | ICD-10-CM

## 2024-05-18 DIAGNOSIS — E7800 Pure hypercholesterolemia, unspecified: Secondary | ICD-10-CM

## 2024-05-26 NOTE — Progress Notes (Signed)
 SAHAND GOSCH                                          MRN: [id number]   05/26/2024   The [facility] Quality Team Specialist reviewed this patient medical record for the purposes of chart review for care gap closure. The following were reviewed: chart review for care gap closure-controlling blood pressure.    [facility]BCI Quality Team

## 2024-08-11 ENCOUNTER — Ambulatory Visit: Payer: Self-pay | Admitting: Family Medicine
